# Patient Record
Sex: Male | Born: 1955 | Race: Black or African American | Hispanic: No | Marital: Married | Smoking: Former smoker
Health system: Southern US, Community
[De-identification: ages and names within clinical notes are randomized; demographics above are authoritative.]

## PROBLEM LIST (undated history)

## (undated) DIAGNOSIS — N400 Enlarged prostate without lower urinary tract symptoms: Secondary | ICD-10-CM

## (undated) DIAGNOSIS — I10 Essential (primary) hypertension: Secondary | ICD-10-CM

## (undated) DIAGNOSIS — R972 Elevated prostate specific antigen [PSA]: Secondary | ICD-10-CM

## (undated) DIAGNOSIS — E785 Hyperlipidemia, unspecified: Secondary | ICD-10-CM

## (undated) DIAGNOSIS — C801 Malignant (primary) neoplasm, unspecified: Secondary | ICD-10-CM

## (undated) DIAGNOSIS — I358 Other nonrheumatic aortic valve disorders: Secondary | ICD-10-CM

## (undated) DIAGNOSIS — M25473 Effusion, unspecified ankle: Secondary | ICD-10-CM

## (undated) HISTORY — DX: Hyperlipidemia, unspecified: E78.5

## (undated) HISTORY — DX: Essential (primary) hypertension: I10

## (undated) HISTORY — DX: Elevated prostate specific antigen (PSA): R97.20

## (undated) HISTORY — DX: Effusion, unspecified ankle: M25.473

## (undated) HISTORY — PX: OTHER SURGICAL HISTORY: SHX169

## (undated) HISTORY — DX: Other nonrheumatic aortic valve disorders: I35.8

## (undated) HISTORY — DX: Benign prostatic hyperplasia without lower urinary tract symptoms: N40.0

---

## 2013-02-08 ENCOUNTER — Ambulatory Visit: Payer: Self-pay | Admitting: Family Medicine

## 2013-10-14 DIAGNOSIS — N400 Enlarged prostate without lower urinary tract symptoms: Secondary | ICD-10-CM | POA: Insufficient documentation

## 2014-04-17 DIAGNOSIS — I358 Other nonrheumatic aortic valve disorders: Secondary | ICD-10-CM | POA: Insufficient documentation

## 2014-04-17 DIAGNOSIS — M25473 Effusion, unspecified ankle: Secondary | ICD-10-CM | POA: Insufficient documentation

## 2014-11-21 DIAGNOSIS — R972 Elevated prostate specific antigen [PSA]: Secondary | ICD-10-CM | POA: Insufficient documentation

## 2014-11-21 HISTORY — DX: Elevated prostate specific antigen (PSA): R97.20

## 2014-12-26 ENCOUNTER — Encounter: Payer: Self-pay | Admitting: Urology

## 2014-12-26 ENCOUNTER — Ambulatory Visit (INDEPENDENT_AMBULATORY_CARE_PROVIDER_SITE_OTHER): Payer: Self-pay | Admitting: Urology

## 2014-12-26 VITALS — BP 159/94 | HR 105 | Ht 68.0 in | Wt 172.5 lb

## 2014-12-26 DIAGNOSIS — I10 Essential (primary) hypertension: Secondary | ICD-10-CM

## 2014-12-26 DIAGNOSIS — R972 Elevated prostate specific antigen [PSA]: Secondary | ICD-10-CM

## 2014-12-26 DIAGNOSIS — R300 Dysuria: Secondary | ICD-10-CM

## 2014-12-26 DIAGNOSIS — N401 Enlarged prostate with lower urinary tract symptoms: Secondary | ICD-10-CM

## 2014-12-26 DIAGNOSIS — N138 Other obstructive and reflux uropathy: Secondary | ICD-10-CM

## 2014-12-26 DIAGNOSIS — E785 Hyperlipidemia, unspecified: Secondary | ICD-10-CM | POA: Insufficient documentation

## 2014-12-26 HISTORY — DX: Hyperlipidemia, unspecified: E78.5

## 2014-12-26 HISTORY — DX: Essential (primary) hypertension: I10

## 2014-12-26 LAB — MICROSCOPIC EXAMINATION

## 2014-12-26 LAB — URINALYSIS, COMPLETE
BILIRUBIN UA: NEGATIVE
Glucose, UA: NEGATIVE
Ketones, UA: NEGATIVE
Nitrite, UA: NEGATIVE
PH UA: 6.5 (ref 5.0–7.5)
Urobilinogen, Ur: 0.2 mg/dL (ref 0.2–1.0)

## 2014-12-26 LAB — BLADDER SCAN AMB NON-IMAGING

## 2014-12-26 NOTE — Patient Instructions (Signed)

## 2014-12-26 NOTE — Progress Notes (Signed)
12/26/2014 9:56 AM   Todd Trevino April 10, 1956 [id number]  Referring provider: No referring provider defined for this encounter.  Chief Complaint  Patient presents with  . Elevated PSA    New patient  . Benign Prostatic Hypertrophy    HPI: 59 yo AAM referred by Dr. Ola Spurr for ~1 year of urinary symptoms including terminal dysuria and occation urinary frequency/ urgency and elevated PSA up to 11.47 ng/dL.  He does have variable urinary flow, sometimes good, sometimes not as good.  He does feel that he is able to empty his bladder for the most part.   No gross hematuria.  He has severe nocturia x 5.  No incontinence.  He's had several UAs which are somewhat suspicious but urine cultures have been negative.  His symptoms are sometimes better and sometimes worse but his symptoms have primarily been over the past year.    He does drink a lot of water throughout the day.     He was started on Flomax by his PCP by ~6 months ago which does help but he has only been using in intermittently.  He recently started back on this medication.   He just completed a course of Bactrim x 28 day for presumed prostatitis which has improved his symptoms.   He has seen some mild improvement in his voiding symptoms since starting this medication.   +FH prostate cancer in his younger brother, s/p prostatectomy 1 year ago.    PSA trend below:  Component Name  11/14/2014 07/10/2014 04/10/2014 10/07/2013    11.47 (H) 5.65 (H) 4.86 (H) 5.05 (H)   PSA (Prostate Specific Antigen), Total         IPSS      12/26/14 1000       International Prostate Symptom Score   How often have you had the sensation of not emptying your bladder? About half the time     How often have you had to urinate less than every two hours? Almost always     How often have you found you stopped and started again several times when you urinated? About half the time     How often have you found it difficult to postpone  urination? More than half the time     How often have you had a weak urinary stream? About half the time     How often have you had to strain to start urination? Not at All     How many times did you typically get up at night to urinate? 5 Times     Total IPSS Score 23     Quality of Life due to urinary symptoms   If you were to spend the rest of your life with your urinary condition just the way it is now how would you feel about that? Unhappy          PMH: Past Medical History  Diagnosis Date  . Hyperlipidemia   . HTN (hypertension)   . BPH (benign prostatic hyperplasia)   . Aortic heart murmur   . Ankle edema     Surgical History: Past Surgical History  Procedure Laterality Date  . None      Home Medications:    Medication List       This list is accurate as of: 12/26/14  9:56 AM.  Always use your most recent med list.               amLODipine 2.5 MG tablet  Commonly known as:  NORVASC  Take 2.5 mg by mouth daily.     aspirin 81 MG tablet  Take 81 mg by mouth daily.     lisinopril-hydrochlorothiazide 10-12.5 MG per tablet  Commonly known as:  PRINZIDE,ZESTORETIC  Take 1 tablet by mouth daily.     pravastatin 40 MG tablet  Commonly known as:  PRAVACHOL  Take 40 mg by mouth daily.     tamsulosin 0.4 MG Caps capsule  Commonly known as:  FLOMAX  Take 0.4 mg by mouth.        Allergies: No Known Allergies  Family History: Family History  Problem Relation Age of Onset  . Stomach cancer Father   . Prostate cancer Brother   . Hypertension Sister     Social History:  reports that he has quit smoking. He does not have any smokeless tobacco history on file. He reports that he does not drink alcohol or use illicit drugs.  ROS: UROLOGY Frequent Urination?: Yes Hard to postpone urination?: Yes Burning/pain with urination?: Yes Get up at night to urinate?: Yes Leakage of urine?: No Urine stream starts and stops?: Yes Trouble starting stream?: No Do  you have to strain to urinate?: No Blood in urine?: No Urinary tract infection?: Yes Sexually transmitted disease?: No Injury to kidneys or bladder?: No Painful intercourse?: No Weak stream?: No Erection problems?: No Penile pain?: Yes  Gastrointestinal Nausea?: No Vomiting?: No Indigestion/heartburn?: No Diarrhea?: No Constipation?: No  Constitutional Fever: No Night sweats?: No Weight loss?: No Fatigue?: No  Skin Skin rash/lesions?: No Itching?: No  Eyes Blurred vision?: Yes Double vision?: No  Ears/Nose/Throat Sore throat?: No Sinus problems?: No  Hematologic/Lymphatic Swollen glands?: Yes Easy bruising?: No  Cardiovascular Leg swelling?: Yes Chest pain?: No  Respiratory Cough?: No Shortness of breath?: No  Endocrine Excessive thirst?: No  Musculoskeletal Back pain?: Yes Joint pain?: Yes  Neurological Headaches?: No Dizziness?: No  Psychologic Depression?: No Anxiety?: No  Physical Exam: BP 159/94 mmHg  Pulse 105  Ht 5\' 8"  (1.727 m)  Wt 172 lb 8 oz (78.245 kg)  BMI 26.23 kg/m2  Constitutional:  Alert and oriented, No acute distress. HEENT: [place] AT, moist mucus membranes.  Trachea midline, no masses. Cardiovascular: No clubbing, cyanosis, or edema. Respiratory: Normal respiratory effort, no increased work of breathing. GI: Abdomen is soft, nontender, nondistended, no abdominal masses GU: No CVA tenderness. Bilaterally descended testicles. No masses. Uncircumcised phallus with easily retractable foreskin. Rectal exam today with normal external sphincter, 30+ cc prostate, nontender, no nodules. Skin: No rashes, bruises or suspicious lesions. Lymph: No cervical or inguinal adenopathy. Neurologic: Grossly intact, no focal deficits, moving all 4 extremities. Psychiatric: Normal mood and affect.  Laboratory Data: PSA (Prostate Specific Antigen), Total 11.47 (H) 0.10-4.00 ng/mL    Urinalysis Results for orders placed or performed in visit  on 12/26/14  Microscopic Examination  Result Value Ref Range   WBC, UA >30W 0 -  5 /hpf   RBC, UA >30R 0 -  2 /hpf   Epithelial Cells (non renal) >10E 0 - 10 /hpf   Mucus, UA Present (A) Not Estab.   Bacteria, UA Moderate (A) None seen/Few  Urinalysis, Complete  Result Value Ref Range   Specific Gravity, UA >1.030 (H) 1.005 - 1.030   pH, UA 6.5 5.0 - 7.5   Color, UA Yellow Yellow   Appearance Ur Cloudy (A) Clear   Leukocytes, UA 2+ (A) Negative   Protein, UA 3+ (A) Negative/Trace   Glucose,  UA Negative Negative   Ketones, UA Negative Negative   RBC, UA 3+ (A) Negative   Bilirubin, UA Negative Negative   Urobilinogen, Ur 0.2 0.2 - 1.0 mg/dL   Nitrite, UA Negative Negative   Microscopic Examination See below:   BLADDER SCAN AMB NON-IMAGING  Result Value Ref Range   Scan Result 3ml     Pertinent Imaging: PVR 25   Assessment & Plan:  59 year old male with BPH with LUTS, dysuria and most recently an elevated PSA to 11. His UA today is suspicious for infection although previous culture has been negative and he recently completed a course of antibiotics.  Repeat the culture today as well as his PSA. If his culture is negative and the PSA remains elevated, I would recommend proceeding with prostate biopsy.  1. BPH with obstruction/lower urinary tract symptoms - continue Flomax - Urinalysis, Complete - BLADDER SCAN AMB NON-IMAGING  2. Elevated PSA  We reviewed the implications of an elevated PSA and the uncertainty surrounding it. In general, a man's PSA increases with age and is produced by both normal and cancerous prostate tissue. Differential for elevated PSA is BPH, prostate cancer, infection, recent intercourse/ejaculation, prostate infarction, recent urethroscopic manipulation (foley placement/cystoscopy) and prostatitis. Management of an elevated PSA can include observation or prostate biopsy and wediscussed this in detail.  We discussed that indications for prostate biopsy are  defined by age and race specific PSA cutoffs as well as a PSA velocity of 0.75/year.  We discussed prostate biopsy in detail including the procedure itself, the risks of blood in the urine, stool, and ejaculate, serious infection, and discomfort. He is willing to proceed with this as discussed.  - PSA -will call with recommendation  3. Dysuria -UA suspicious, will send of culture and treat as needed -if culture negative, will need repeat UA/ possible cysto - CULTURE, URINE COMPREHENSIVE   Will call with follow up plan.  Hollice Espy, MD  [facility] [address] [place], [place] [address] [phone]

## 2014-12-27 ENCOUNTER — Telehealth: Payer: Self-pay

## 2014-12-27 LAB — PSA: Prostate Specific Ag, Serum: 10.3 ng/mL — ABNORMAL HIGH (ref 0.0–4.0)

## 2014-12-27 NOTE — Telephone Encounter (Signed)
-----   Message from Hollice Espy, MD sent at 12/27/2014  7:35 AM EDT ----- Please let this patient know that his PSA is down somewhat but his urine was highly suspicious for infection.  As such, I'd like to call in some abx once I have the culture results, and then have him return in 3 months for PSA/ DRE and recheck of symptoms (any provider).    Hollice Espy, MD  Please forward note to admin to arrange follow up.

## 2014-12-27 NOTE — Telephone Encounter (Signed)
LMOM

## 2014-12-27 NOTE — Telephone Encounter (Signed)
Spoke with pt in reference to urine cx and f/u. Pt voiced understanding. Pt was transferred to the front to make f/u appt.

## 2014-12-28 LAB — CULTURE, URINE COMPREHENSIVE

## 2015-03-29 ENCOUNTER — Ambulatory Visit: Payer: Self-pay | Admitting: Urology

## 2015-06-29 ENCOUNTER — Encounter: Payer: Self-pay | Admitting: Urology

## 2015-06-29 ENCOUNTER — Ambulatory Visit (INDEPENDENT_AMBULATORY_CARE_PROVIDER_SITE_OTHER): Payer: BLUE CROSS/BLUE SHIELD | Admitting: Urology

## 2015-06-29 VITALS — BP 164/83 | HR 74 | Ht 68.0 in | Wt 181.1 lb

## 2015-06-29 DIAGNOSIS — N400 Enlarged prostate without lower urinary tract symptoms: Secondary | ICD-10-CM | POA: Diagnosis not present

## 2015-06-29 DIAGNOSIS — R972 Elevated prostate specific antigen [PSA]: Secondary | ICD-10-CM | POA: Diagnosis not present

## 2015-06-29 DIAGNOSIS — N390 Urinary tract infection, site not specified: Secondary | ICD-10-CM

## 2015-06-29 DIAGNOSIS — R8281 Pyuria: Secondary | ICD-10-CM

## 2015-06-29 LAB — URINALYSIS, COMPLETE
Bilirubin, UA: NEGATIVE
Glucose, UA: NEGATIVE
Ketones, UA: NEGATIVE
Nitrite, UA: NEGATIVE
Specific Gravity, UA: >1.03 — ABNORMAL HIGH (ref 1.005–1.030)
Urobilinogen, Ur: 0.2 mg/dL (ref 0.2–1.0)
pH, UA: 6 (ref 5.0–7.5)

## 2015-06-29 LAB — MICROSCOPIC EXAMINATION

## 2015-06-29 LAB — BLADDER SCAN AMB NON-IMAGING: SCAN RESULT: 31
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
## 2015-06-30 LAB — PSA: PROSTATE SPECIFIC AG, SERUM: 6.6 ng/mL — AB (ref 0.0–4.0)

## 2015-07-02 LAB — CULTURE, URINE COMPREHENSIVE

## 2015-07-09 ENCOUNTER — Telehealth: Payer: Self-pay

## 2015-07-09 NOTE — Telephone Encounter (Signed)
LMOM

## 2015-07-09 NOTE — Telephone Encounter (Signed)
-----   Message from Hollice Espy, MD sent at 07/08/2015  2:52 PM EST ----- PSA down to 6.6 from 10.3 but still quite elevated.  Recommend biopsy but if he refuses, then recommend repeat PSA/ DRE in 4 months at minimum and stress importance of close follow up. Please arrange for this.  Also review biopsy instructions if elects to proceed with biopsy.    Hollice Espy, MD

## 2015-07-10 NOTE — Telephone Encounter (Signed)
Spoke with pt in reference to PSA and prostate bx. Pt elected to monitor PSA closely. Pt asked is there any type of medication he can take to help get his levels lowered. Please advise.

## 2015-07-10 NOTE — Telephone Encounter (Signed)
LMOM

## 2015-12-28 ENCOUNTER — Encounter: Payer: Self-pay | Admitting: Urology

## 2015-12-28 ENCOUNTER — Ambulatory Visit (INDEPENDENT_AMBULATORY_CARE_PROVIDER_SITE_OTHER): Payer: BLUE CROSS/BLUE SHIELD | Admitting: Urology

## 2015-12-28 VITALS — BP 221/122 | HR 90 | Ht 68.0 in | Wt 188.2 lb

## 2015-12-28 DIAGNOSIS — N138 Other obstructive and reflux uropathy: Secondary | ICD-10-CM

## 2015-12-28 DIAGNOSIS — N401 Enlarged prostate with lower urinary tract symptoms: Secondary | ICD-10-CM

## 2015-12-28 DIAGNOSIS — R972 Elevated prostate specific antigen [PSA]: Secondary | ICD-10-CM

## 2015-12-28 DIAGNOSIS — I160 Hypertensive urgency: Secondary | ICD-10-CM

## 2015-12-28 NOTE — Progress Notes (Signed)
10:28 AM  12/28/15   Maida Sale [birth year] [id number]  Referring provider: Leonel Ramsay, MD [place], [place] [address]  Chief Complaint  Patient presents with  . Follow-up    BPH    HPI: 60 yo AAM with BPH with LUTS and history of elevated PSA.  BPH with LUTS Resumed Flomax since last visit, has seen some improvement in flow/ stream.  Recent taken off HCTZ with improvement in urinary urgency.  Getting up every 1.5 hours at night to urinate.  He does drink a lot of water.    Previous PVRs all less than 100 cc.    No UTI symptoms.        IPSS    Row Name 12/28/15 1000         International Prostate Symptom Score   How often have you had the sensation of not emptying your bladder? About half the time     How often have you had to urinate less than every two hours? More than half the time     How often have you found you stopped and started again several times when you urinated? Less than half the time     How often have you found it difficult to postpone urination? More than half the time     How often have you had a weak urinary stream? Less than 1 in 5 times     How often have you had to strain to start urination? Not at All     How many times did you typically get up at night to urinate? 5 Times     Total IPSS Score 19       Quality of Life due to urinary symptoms   If you were to spend the rest of your life with your urinary condition just the way it is now how would you feel about that? Mixed        Score:  1-7 Mild 8-19 Moderate 20-35 Severe   History of elevated PSA +FH prostate cancer in his younger brother, s/p prostatectomy 1.5 years ago.    PSA trend below:  5.05   09/2013 4.86    04/2014 5.65    07/2014 11.47   11/2014 10.3    12/2014 6.6    06/2015  Rectal exam last visit 30cc with induration at apex but no nodules.  PSA repeated today.     HTN Recent adjustment in BP meds.  Profoundly HTN today,  asymptomatic.  Has not taking losartan for past 2 days due to stomach upset.     PMH: Past Medical History:  Diagnosis Date  . Abnormal prostate specific antigen 11/21/2014  . Ankle edema   . Aortic heart murmur   . BP (high blood pressure) 12/26/2014  . BPH (benign prostatic hyperplasia)   . Elevated PSA   . HLD (hyperlipidemia) 12/26/2014  . HTN (hypertension)   . Hyperlipidemia     Surgical History: Past Surgical History:  Procedure Laterality Date  . none      Home Medications:    Medication List       Accurate as of 12/28/15 10:28 AM. Always use your most recent med list.          amLODipine 2.5 MG tablet Commonly known as:  NORVASC Take 2.5 mg by mouth daily.   aspirin 81 MG tablet Take 81 mg by mouth daily.   lisinopril-hydrochlorothiazide 10-12.5 MG tablet Commonly known as:  PRINZIDE,ZESTORETIC Take  1 tablet by mouth daily.   losartan 50 MG tablet Commonly known as:  COZAAR Take by mouth.   pravastatin 40 MG tablet Commonly known as:  PRAVACHOL Take 40 mg by mouth daily.   tamsulosin 0.4 MG Caps capsule Commonly known as:  FLOMAX Take 0.4 mg by mouth. Reported on 06/29/2015       Allergies: No Known Allergies  Family History: Family History  Problem Relation Age of Onset  . Stomach cancer Father   . Prostate cancer Brother   . Hypertension Sister   . Kidney disease Neg Hx     Social History:  reports that he has quit smoking. He has never used smokeless tobacco. He reports that he does not drink alcohol or use drugs.  ROS: UROLOGY Frequent Urination?: Yes Hard to postpone urination?: Yes Burning/pain with urination?: Yes Get up at night to urinate?: Yes Leakage of urine?: No Urine stream starts and stops?: Yes Trouble starting stream?: No Do you have to strain to urinate?: No Blood in urine?: No Urinary tract infection?: No Sexually transmitted disease?: No Injury to kidneys or bladder?: No Painful intercourse?: No Weak  stream?: No Erection problems?: No Penile pain?: No  Gastrointestinal Nausea?: No Vomiting?: No Indigestion/heartburn?: No Diarrhea?: No Constipation?: No  Constitutional Fever: No Night sweats?: No Weight loss?: No Fatigue?: No  Skin Skin rash/lesions?: No Itching?: No  Eyes Blurred vision?: No Double vision?: No  Ears/Nose/Throat Sore throat?: No Sinus problems?: No  Hematologic/Lymphatic Swollen glands?: No Easy bruising?: No  Cardiovascular Leg swelling?: No Chest pain?: No  Respiratory Cough?: No Shortness of breath?: No  Endocrine Excessive thirst?: No  Musculoskeletal Back pain?: No Joint pain?: No  Neurological Headaches?: No Dizziness?: No     Physical Exam: BP (!) 221/122 (BP Location: Left Arm, Patient Position: Sitting, Cuff Size: Normal)   Pulse 90   Ht 5\' 8"  (1.727 m)   Wt 188 lb 3.2 oz (85.4 kg)   BMI 28.62 kg/m   Constitutional:  Alert and oriented, No acute distress. HEENT: [place] AT, moist mucus membranes.  Trachea midline, no masses. Cardiovascular: No clubbing, cyanosis, or edema. Respiratory: Normal respiratory effort, no increased work of breathing. GI: Abdomen is soft, nontender, nondistended, no abdominal masses GU: No CVA tenderness.  Rectal exam deferred today, performed last 6 months ago 06/2015 Skin: No rashes, bruises or suspicious lesions. Neurologic: Grossly intact, no focal deficits, moving all 4 extremities. Psychiatric: Normal mood and affect.  Laboratory Data: PSA trend as above Assessment & Plan:     1. BPH (benign prostatic hyperplasia) with lower urinary tract symptoms Continue flomax Seen improvement in urinary symptoms after stopping HCTZ Behavioral modification for nocturia discussed- does not have issue when avoids drinking bed  2. Elevated PSA PSA repeated today, history of fluctuation presumably related to inflammation If PSA up again, will consider biopsy.  He will need to stop ASA if biopsy  recommended.  Reviewed procedure. Will call with plan. - PSA   3. Hypertensive urgency Asymptomatic Discussed with Dr. Blane Ohara nurse, advised to resume lisinopril-HCTZ today if unable to tolerated Losartan Follow up with PCP ED if becomes symptomatic  Hollice Espy, MD  [facility] [address] [place], [place] [address] [phone]

## 2015-12-29 LAB — PSA: PROSTATE SPECIFIC AG, SERUM: 4.6 ng/mL — AB (ref 0.0–4.0)

## 2015-12-31 ENCOUNTER — Telehealth: Payer: Self-pay | Admitting: *Deleted

## 2015-12-31 NOTE — Telephone Encounter (Signed)
LMOM for patient to call office back. Psa continues to improve and we will continue to follow as discussed.

## 2015-12-31 NOTE — Telephone Encounter (Signed)
-----   Message from Hollice Espy, MD sent at 12/30/2015  1:34 PM EDT ----- PSA continues to improve.  We will continue to follow as discussed.  Hollice Espy, MD

## 2016-01-02 NOTE — Telephone Encounter (Signed)
LMOM for patient to call office back. 

## 2016-06-12 ENCOUNTER — Encounter: Payer: Self-pay | Admitting: Urology

## 2016-06-12 ENCOUNTER — Ambulatory Visit: Payer: BLUE CROSS/BLUE SHIELD | Admitting: Urology

## 2016-06-12 VITALS — BP 169/93 | HR 67 | Ht 68.0 in | Wt 176.2 lb

## 2016-06-12 DIAGNOSIS — N400 Enlarged prostate without lower urinary tract symptoms: Secondary | ICD-10-CM

## 2016-06-12 DIAGNOSIS — R350 Frequency of micturition: Secondary | ICD-10-CM

## 2016-06-12 DIAGNOSIS — R3129 Other microscopic hematuria: Secondary | ICD-10-CM | POA: Diagnosis not present

## 2016-06-12 DIAGNOSIS — R972 Elevated prostate specific antigen [PSA]: Secondary | ICD-10-CM | POA: Diagnosis not present

## 2016-06-12 LAB — URINALYSIS, COMPLETE
Bilirubin, UA: NEGATIVE
Glucose, UA: NEGATIVE
Nitrite, UA: NEGATIVE
PH UA: 7 (ref 5.0–7.5)
Specific Gravity, UA: 1.02 (ref 1.005–1.030)
Urobilinogen, Ur: 4 mg/dL — ABNORMAL HIGH (ref 0.2–1.0)

## 2016-06-12 LAB — MICROSCOPIC EXAMINATION: RBC, UA: >30 /hpf — AB (ref 0–?)

## 2016-06-12 LAB — BLADDER SCAN AMB NON-IMAGING: SCAN RESULT: 0

## 2016-06-12 NOTE — Progress Notes (Signed)
3:15 PM  06/12/16   Maida Sale December 29, 1955 [id number]  Referring provider: Leonel Ramsay, MD [place], [place] [address]  Chief Complaint  Patient presents with  . Benign Prostatic Hypertrophy    6 month follow up  . Elevated PSA    HPI: Patient is a 61 yo [place] American male with BPH with LUTS and history of elevated PSA who presents today with the complaint of increased night time urination.  BPH WITH LUTS His IPSS score today is 26, which is severe lower urinary tract symptomatology. He is unhappy with his quality life due to his urinary symptoms.  His PVR is 0 mL.      His major complaint today are frequency, urgency, dysuria, nocturia x 5, intermittency, straining to urinate and penile pain.  He has had these symptoms for the last several weeks.  He denies any dysuria, hematuria or suprapubic pain.   He currently taking tamsulosin 0.4 mg daily.   He also denies any recent fevers, chills, nausea or vomiting.  He was seen last week at [facility] urgent care and UA was positive for 10-50WBC's, 10-50 RBC's and rare bacteria.  Patient was given a 5 day course of Cipro without relief.  His urine culture was negative.  His UA today was positive for > 30 WBC's and > 30 RBC's.       IPSS    Row Name 06/12/16 1400         International Prostate Symptom Score   How often have you had the sensation of not emptying your bladder? Almost always     How often have you had to urinate less than every two hours? Almost always     How often have you found you stopped and started again several times when you urinated? Less than 1 in 5 times     How often have you found it difficult to postpone urination? Almost always     How often have you had a weak urinary stream? Less than 1 in 5 times     How often have you had to strain to start urination? More than half the time     How many times did you typically get up at  night to urinate? 5 Times     Total IPSS Score 26       Quality of Life due to urinary symptoms   If you were to spend the rest of your life with your urinary condition just the way it is now how would you feel about that? Unhappy        Score:  1-7 Mild 8-19 Moderate 20-35 Severe   History of elevated PSA +FH prostate cancer in his younger brother, s/p prostatectomy 1.5 years ago.    PSA trend below:  5.05   09/2013 4.86    04/2014 5.65    07/2014 11.47   11/2014 10.3    12/2014 6.6    06/2015  Rectal exam last visit 30cc with induration at apex but no nodules.  PSA repeated today.     PMH: Past Medical History:  Diagnosis Date  . Abnormal prostate specific antigen 11/21/2014  . Ankle edema   . Aortic heart murmur   . BP (high blood pressure) 12/26/2014  . BPH (benign prostatic hyperplasia)   . Elevated PSA   . HLD (hyperlipidemia) 12/26/2014  . HTN (hypertension)   . Hyperlipidemia     Surgical History:  Past Surgical History:  Procedure Laterality Date  . none      Home Medications:  Allergies as of 06/12/2016   No Known Allergies     Medication List       Accurate as of 06/12/16  3:15 PM. Always use your most recent med list.          amLODipine 2.5 MG tablet Commonly known as:  NORVASC Take 2.5 mg by mouth daily.   aspirin 81 MG tablet Take 81 mg by mouth daily.   ciprofloxacin 500 MG tablet Commonly known as:  CIPRO Take by mouth.   lisinopril-hydrochlorothiazide 10-12.5 MG tablet Commonly known as:  PRINZIDE,ZESTORETIC Take 1 tablet by mouth daily.   losartan 50 MG tablet Commonly known as:  COZAAR Take by mouth.   metoprolol succinate 50 MG 24 hr tablet Commonly known as:  TOPROL-XL Take by mouth.   pravastatin 40 MG tablet Commonly known as:  PRAVACHOL Take 40 mg by mouth daily.   tamsulosin 0.4 MG Caps capsule Commonly known as:  FLOMAX Take 0.4 mg by mouth. Reported on 06/29/2015       Allergies: No Known  Allergies  Family History: Family History  Problem Relation Age of Onset  . Stomach cancer Father   . Prostate cancer Brother   . Hypertension Sister   . Kidney disease Neg Hx     Social History:  reports that he has quit smoking. He has never used smokeless tobacco. He reports that he does not drink alcohol or use drugs.  ROS: UROLOGY Frequent Urination?: Yes Hard to postpone urination?: Yes Burning/pain with urination?: Yes Get up at night to urinate?: Yes Leakage of urine?: No Urine stream starts and stops?: Yes Trouble starting stream?: No Do you have to strain to urinate?: Yes Blood in urine?: No Urinary tract infection?: No Sexually transmitted disease?: No Injury to kidneys or bladder?: No Painful intercourse?: No Weak stream?: No Erection problems?: No Penile pain?: Yes  Gastrointestinal Nausea?: No Vomiting?: No Indigestion/heartburn?: No Diarrhea?: No Constipation?: No  Constitutional Fever: No Night sweats?: No Weight loss?: No Fatigue?: No  Skin Skin rash/lesions?: No Itching?: No  Eyes Blurred vision?: No Double vision?: No  Ears/Nose/Throat Sore throat?: No Sinus problems?: No  Hematologic/Lymphatic Swollen glands?: No Easy bruising?: No  Cardiovascular Leg swelling?: No Chest pain?: No  Respiratory Cough?: No Shortness of breath?: No  Endocrine Excessive thirst?: No  Musculoskeletal Back pain?: No Joint pain?: No  Neurological Headaches?: No Dizziness?: No  Psychologic Depression?: No Anxiety?: No  Physical Exam: BP (!) 169/93   Pulse 67   Ht 5\' 8"  (1.727 m)   Wt 176 lb 3.2 oz (79.9 kg)   BMI 26.79 kg/m   Constitutional:  Alert and oriented, No acute distress. HEENT: [place] AT, moist mucus membranes.  Trachea midline, no masses. Cardiovascular: No clubbing, cyanosis, or edema. Respiratory: Normal respiratory effort, no increased work of breathing. GI: Abdomen is soft, nontender, nondistended, no abdominal  masses GU: No CVA tenderness.  Patient with uncircumcised phallus.  Foreskin easily retracted  Urethral meatus is patent.  No penile discharge. No penile lesions or rashes. Scrotum without lesions, cysts, rashes and/or edema.  Testicles are located scrotally bilaterally. No masses are appreciated in the testicles. Left and right epididymis are normal. Rectal: Patient with  normal sphincter tone. Anus and perineum without scarring or rashes. No rectal masses are appreciated. Prostate is approximately 70 grams, no nodules are appreciated. Seminal vesicles are normal. Skin: No rashes, bruises or  suspicious lesions. Neurologic: Grossly intact, no focal deficits, moving all 4 extremities. Psychiatric: Normal mood and affect.  Laboratory Data: PSA trend below:   5.05   09/2013  4.86    04/2014  5.65    07/2014  11.47   11/2014  10.3    12/2014  6.6    06/2015  Pertinent imaging Results for JOSIP, CAVE (MRN [id number]) as of 06/12/2016 14:59  Ref. Range 06/12/2016 14:54  Scan Result Unknown 0    Assessment & Plan:    1. Microscopic hematuria  - I explained to the patient that there are a number of causes that can be associated with blood in the urine, such as stones,  BPH, UTI's, damage to the urinary tract and/or cancer.  - At this time, I felt that the patient warranted further urologic evaluation.   The AUA guidelines state that a CT urogram is the preferred imaging study to evaluate hematuria.  - I explained to the patient that a contrast material will be injected into a vein and that in rare instances, an allergic reaction can result and may even life threatening   The patient denies any allergies to contrast, iodine and/or seafood and is not taking metformin.  - Following the imaging study,  I've recommended a cystoscopy. I described how this is performed, typically in an office setting with a flexible cystoscope. We described the risks, benefits, and possible side effects, the most common  of which is a minor amount of blood in the urine and/or burning which usually resolves in 24 to 48 hours.    - The patient had the opportunity to ask questions which were answered. Based upon this discussion, the patient is willing to proceed. Therefore, I've ordered: a CT Urogram and cystoscopy.  - The patient will return following all of the above for discussion of the results.      - UA  - urine culture  - BUN + creatinine  - will post pone if urine culture is positive   2. BPH with LUTS  - IPSS score is 26/5, it is worsening  - Continue conservative management, avoiding bladder irritants and timed voiding's  - Continue tamsulosin 0.4 mg daily; refills given   - PSA  - RTC in 6 months for IPSS, PSA, PVR and exam   - Toviaz 4 mg daily, # 14  I have advised  of the side effects, such as: Dry eyes, dry mouth, constipation, mental confusion and/or urinary retention. To help with symptoms until work up is complete  3. Elevated PSA  - PSA repeated today, history of fluctuation presumably related to inflammation If PSA up again, will consider biopsy.  He will need to stop ASA if biopsy recommended.  Reviewed procedure.  Zara Council, [facility] [address] [place], [place] [address] [phone]

## 2016-06-13 LAB — BUN+CREAT
BUN / CREAT RATIO: 14 (ref 10–24)
BUN: 16 mg/dL (ref 8–27)
Creatinine, Ser: 1.17 mg/dL (ref 0.76–1.27)
GFR calc Af Amer: 77 mL/min/{1.73_m2} (ref >59)
GFR calc non Af Amer: 66 mL/min/{1.73_m2} (ref >59)

## 2016-06-13 LAB — PSA: PROSTATE SPECIFIC AG, SERUM: 14.8 ng/mL — AB (ref 0.0–4.0)

## 2016-06-15 LAB — CULTURE, URINE COMPREHENSIVE

## 2016-06-30 ENCOUNTER — Ambulatory Visit
Admission: RE | Admit: 2016-06-30 | Discharge: 2016-06-30 | Disposition: A | Discharge status: Home / Self Care | Payer: BLUE CROSS/BLUE SHIELD | Source: Ambulatory Visit | Attending: Urology | Admitting: Urology

## 2016-06-30 DIAGNOSIS — N400 Enlarged prostate without lower urinary tract symptoms: Secondary | ICD-10-CM | POA: Diagnosis not present

## 2016-06-30 DIAGNOSIS — N210 Calculus in bladder: Secondary | ICD-10-CM | POA: Diagnosis not present

## 2016-06-30 DIAGNOSIS — N329 Bladder disorder, unspecified: Secondary | ICD-10-CM | POA: Insufficient documentation

## 2016-06-30 DIAGNOSIS — R3129 Other microscopic hematuria: Secondary | ICD-10-CM | POA: Diagnosis not present

## 2016-06-30 DIAGNOSIS — K4020 Bilateral inguinal hernia, without obstruction or gangrene, not specified as recurrent: Secondary | ICD-10-CM | POA: Diagnosis not present

## 2016-06-30 DIAGNOSIS — N200 Calculus of kidney: Secondary | ICD-10-CM | POA: Diagnosis not present

## 2016-06-30 MED ORDER — IOPAMIDOL (ISOVUE-300) INJECTION 61%
125.0000 mL | Freq: Once | INTRAVENOUS | Status: AC | PRN
  Administered 2016-06-30: 125 mL via INTRAVENOUS

## 2016-07-02 ENCOUNTER — Ambulatory Visit: Payer: BLUE CROSS/BLUE SHIELD | Admitting: Urology

## 2016-07-03 ENCOUNTER — Encounter: Payer: Self-pay | Admitting: Urology

## 2016-07-03 ENCOUNTER — Ambulatory Visit (INDEPENDENT_AMBULATORY_CARE_PROVIDER_SITE_OTHER): Payer: BLUE CROSS/BLUE SHIELD | Admitting: Urology

## 2016-07-03 VITALS — BP 191/111 | HR 82 | Ht 68.0 in | Wt 181.0 lb

## 2016-07-03 DIAGNOSIS — N138 Other obstructive and reflux uropathy: Secondary | ICD-10-CM

## 2016-07-03 DIAGNOSIS — N210 Calculus in bladder: Secondary | ICD-10-CM

## 2016-07-03 DIAGNOSIS — R972 Elevated prostate specific antigen [PSA]: Secondary | ICD-10-CM

## 2016-07-03 DIAGNOSIS — N401 Enlarged prostate with lower urinary tract symptoms: Secondary | ICD-10-CM

## 2016-07-03 DIAGNOSIS — N200 Calculus of kidney: Secondary | ICD-10-CM | POA: Diagnosis not present

## 2016-07-03 DIAGNOSIS — R3129 Other microscopic hematuria: Secondary | ICD-10-CM | POA: Diagnosis not present

## 2016-07-03 LAB — URINALYSIS, COMPLETE
BILIRUBIN UA: NEGATIVE
Glucose, UA: NEGATIVE
Nitrite, UA: NEGATIVE
PH UA: 6 (ref 5.0–7.5)
UUROB: 0.2 mg/dL (ref 0.2–1.0)

## 2016-07-03 LAB — MICROSCOPIC EXAMINATION
Epithelial Cells (non renal): >10 /hpf — AB (ref 0–10)
RBC, UA: >30 /hpf — AB (ref 0–?)

## 2016-07-03 MED ORDER — LIDOCAINE HCL 2 % EX GEL
1.0000 "application " | Freq: Once | CUTANEOUS | Status: AC
  Administered 2016-07-03: 1 via URETHRAL

## 2016-07-03 MED ORDER — CIPROFLOXACIN HCL 500 MG PO TABS
500.0000 mg | ORAL_TABLET | Freq: Once | ORAL | Status: AC
  Administered 2016-07-03: 500 mg via ORAL

## 2016-07-03 MED ORDER — FINASTERIDE 5 MG PO TABS: 5.0000 mg | ORAL_TABLET | Freq: Every day | ORAL | 11 refills | Status: DC

## 2016-07-03 NOTE — Progress Notes (Addendum)
07/03/2016 10:06 AM   Todd Trevino 1956/03/25 [id number]  Referring provider: Leonel Ramsay, MD [place], [place] [address]  Chief Complaint  Patient presents with  . Cysto    HPI: 62 year old male with BPH with lower urinary tract symptoms, history of elevated PSA who returns today after CT urogram for further evaluation of ongoing microscopic hematuria.   BPH WITH LUTS CT urogram shows a large 4.7 x 4.2 cm bladder calculus with diffuse bladder wall thickening appreciated likely related to cystitis versus chronic bladder outlet obstruction. In addition, he has very small bilateral nonobstructing renal calculi and largest of which is 5 mm.  Prostate is only mildly enlarged on CT with slight mass effect into bladder, no other significant GU findings.    He was initially scheduled today for cystoscopy, however, in light of the findings, this was deferred and further management of his large bladder stone was discussed.  At baseline, he does have severe voiding issues, primarily irritative including frequency, urgency, dysuria, nocturia 5, intermittency and weak stream. He also reported a history of penile pain. He is currently on Flomax and was given samples of Toviaz which have not helped much.  He's had several urinalyses which are suspicious for infection but cultures negative.  History of elevated PSA +FH prostate cancer in his younger brother, s/p prostatectomy   PSA trend below:  5.05   09/2013 4.86    04/2014 5.65    07/2014 11.47   11/2014 10.3    12/2014 6.6    06/2015 4.6  7/17 14.8 06/12/16  Rectal exam has been unremarkable, 30 g without nodules   PMH: Past Medical History:  Diagnosis Date  . Abnormal prostate specific antigen 11/21/2014  . Ankle edema   . Aortic heart murmur   . BP (high blood pressure) 12/26/2014  . BPH (benign prostatic hyperplasia)   . Elevated PSA   . HLD  (hyperlipidemia) 12/26/2014  . HTN (hypertension)   . Hyperlipidemia     Surgical History: Past Surgical History:  Procedure Laterality Date  . none      Home Medications:  Allergies as of 07/03/2016   No Known Allergies     Medication List       Accurate as of 07/03/16 10:06 AM. Always use your most recent med list.          amLODipine 2.5 MG tablet Commonly known as:  NORVASC Take 2.5 mg by mouth daily.   aspirin 81 MG tablet Take 81 mg by mouth daily.   finasteride 5 MG tablet Commonly known as:  PROSCAR Take 1 tablet (5 mg total) by mouth daily.   lisinopril-hydrochlorothiazide 10-12.5 MG tablet Commonly known as:  PRINZIDE,ZESTORETIC Take 1 tablet by mouth daily.   losartan 50 MG tablet Commonly known as:  COZAAR Take by mouth.   metoprolol succinate 50 MG 24 hr tablet Commonly known as:  TOPROL-XL Take by mouth.   pravastatin 40 MG tablet Commonly known as:  PRAVACHOL Take 40 mg by mouth daily.   tamsulosin 0.4 MG Caps capsule Commonly known as:  FLOMAX Take 0.4 mg by mouth. Reported on 06/29/2015       Allergies: No Known Allergies  Family History: Family History  Problem Relation Age of Onset  . Stomach cancer Father   . Prostate cancer Brother   . Hypertension Sister   . Kidney disease Neg Hx     Social History:  reports that  he has quit smoking. He has never used smokeless tobacco. He reports that he does not drink alcohol or use drugs.  ROS: 12 point ROS negative other than as per HPI  Physical Exam: BP (!) 191/111   Pulse 82   Ht 5\' 8"  (1.727 m)   Wt 181 lb (82.1 kg)   BMI 27.52 kg/m   Constitutional:  Alert and oriented, No acute distress. HEENT: [place] AT, moist mucus membranes.  Trachea midline, no masses. Cardiovascular: No clubbing, cyanosis, or edema. Respiratory: Normal respiratory effort, no increased work of breathing. GI: Abdomen is soft, nontender, nondistended, no abdominal masses GU: No CVA tenderness.  Skin: No  rashes, bruises or suspicious lesions. Lymph: No cervical or inguinal adenopathy. Neurologic: Grossly intact, no focal deficits, moving all 4 extremities. Psychiatric: Normal mood and affect.  Laboratory Data:  Lab Results  Component Value Date   CREATININE 1.17 06/12/2016   PSA as above   Pertinent Imaging: CLINICAL DATA:  Microscopic hematuria. Worsening dysuria for 1 year.  EXAM: CT ABDOMEN AND PELVIS WITHOUT AND WITH CONTRAST  TECHNIQUE: Multidetector CT imaging of the abdomen and pelvis was performed following the standard protocol before and following the bolus administration of intravenous contrast.  CONTRAST:  183mL ISOVUE-300 IOPAMIDOL (ISOVUE-300) INJECTION 61%  COMPARISON:  None.  FINDINGS: Lower Chest: No acute findings.  Hepatobiliary:  No masses identified. Gallbladder is unremarkable.  Pancreas:  No mass or inflammatory changes.  Spleen: Within normal limits in size and appearance.  Adrenals/Urinary Tract: Several small renal calculi are seen bilaterally, largest measuring 5 mm. No evidence of ureteral calculi or hydronephrosis. A large calculus is seen within the urinary bladder measuring 4.7 x 4.2 cm. Diffuse bladder wall thickening is seen, may be due to cystitis or chronic bladder outlet obstruction.  Stomach/Bowel: No evidence of obstruction, inflammatory process or abnormal fluid collections.  Vascular/Lymphatic: No pathologically enlarged lymph nodes. No abdominal aortic aneurysm.  Reproductive: Mildly enlarged prostate gland, which indents bladder base.  Other:  Small bilateral inguinal hernias containing only fat.  Musculoskeletal:  No suspicious bone lesions identified.  IMPRESSION: Nonobstructing bilateral renal calculi measuring up to 5 mm. No evidence of ureteral calculi or hydronephrosis.  Large 4.7 cm calculus within urinary bladder. Diffuse bladder wall thickening, suspicious for cystitis or chronic bladder  outlet obstruction. Recommend correlation with cystoscopy.  No radiographic evidence of urinary tract neoplasm.  Mildly enlarged prostate.   Electronically Signed   By: Earle Gell M.D.   On: 06/30/2016 10:31   CT scan partially reviewed today with the patient.  Assessment & Plan:    1. Microscopic hematuria Likely secondary to large bladder stone, see below - Urinalysis, Complete - ciprofloxacin (CIPRO) tablet 500 mg; Take 1 tablet (500 mg total) by mouth once. - lidocaine (XYLOCAINE) 2 % jelly 1 application; Place 1 application into the urethra once. - CULTURE, URINE COMPREHENSIVE  2. Bladder stone Large 4.7 cm bladder stone leak and trending to severe irritative voiding symptoms Cystoscopy deferred today in the office as visualization will likely be poor due to presence of large stone  Options for management of the stone include continued observation (not recommended), open cystolithotomy (prefered) vs. endoscopic cystolitholapaxy. Risk and benefits of each were reviewed in detail.  Given the size of the stone, I would strongly recommend open cystolitholapaxy as this will be the most efficient way to remove the entire stone without leaving fragments. We reviewed the surgical procedure itself as well as the postoperative care including need for indwelling  Foley. Risks including bleeding, damage to surrounding structures, pain, infection, urine leak, and need for Foley catheter were reviewed. All of his questions were answered.  This point time, he is considering a second opinion which was ordered today, referral to [facility].  I have advised him to let us know if he like to go ahead and schedule surgery.    - Ambulatory referral to Urology  3. BPH with obstruction/lower urinary tract symptoms Given the presence of a large bladder stone, bladder outlet obstruction is suspected despite relatively small prostate volume. Given his fluctuating PSA, I do feel that its important to  assess the need for possible prostate biopsy prior to performing outlet procedure.   Additionally, suspect the majority of his symptoms are from the bladder stone itself and not from his outlet obstruction. May consider urodynamics in the future upon reassessment once the bladder stone has been adequately treated. In that regard, would recommend staged treatment for bladder stone followed by consideration of outlet procedure in the future pending the above.  4. Elevated PSA History of fluctuating PSA Most recent jump in PSA likely related to inflammation secondary to large bladder stone Recommend treatment of the bladder stone and repeat PSA to assess for normalization PSA fails to normalize, would recommend prostate biopsy  5. Nonobstructing kidney stones Asymptomatic, largest 5 mm No intervention recommended at this time Will continue to follow  Patient will call us to let us know if he like to schedule surgery. He's been referred for second opinion as requested.    Hollice Espy, MD  [facility] [address], [place], [place] [address] [phone]  I spent 25 min with this patient of which greater than 50% was spent in counseling and coordination of care with the patient.

## 2016-07-04 ENCOUNTER — Telehealth: Payer: Self-pay | Admitting: Urology

## 2016-07-04 NOTE — Telephone Encounter (Signed)
Referral faxed to [facility] at [phone] They will review and contact the patient with an app.   Sharyn Lull

## 2016-07-05 LAB — CULTURE, URINE COMPREHENSIVE

## 2016-07-07 ENCOUNTER — Other Ambulatory Visit: Payer: BLUE CROSS/BLUE SHIELD

## 2017-05-07 IMAGING — CT CT ABD-PEL WO/W CM
1 of 4 series · 8 of 32 positions shown, 13 images · IV contrast (APPLIED)
Comparison: None.

CLINICAL DATA: Microscopic hematuria. Worsening dysuria for 1 year.

EXAM:
CT ABDOMEN AND PELVIS WITHOUT AND WITH CONTRAST
TECHNIQUE: Multidetector CT imaging of the abdomen and pelvis was performed
following the standard protocol before and following the bolus
administration of intravenous contrast.
CONTRAST:  125mL [id number] IOPAMIDOL ([id number]) INJECTION 61%

[Series 13: axial delay · axial · delayed · 0.81mm/px · z∈[-855,-480]mm · 8 of 97 slices shown, 13 images]
[im 11/97  soft-tissue]
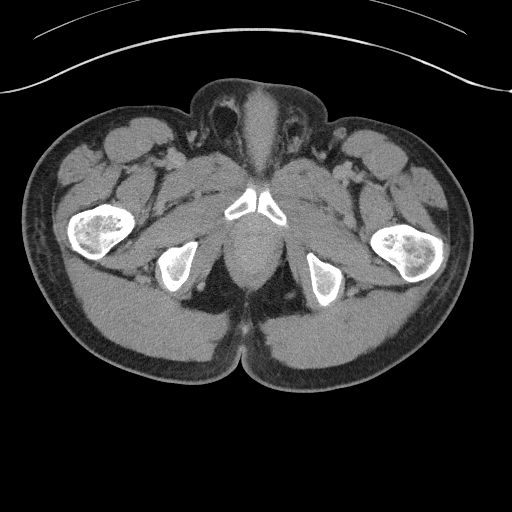
[im 11/97  bone]
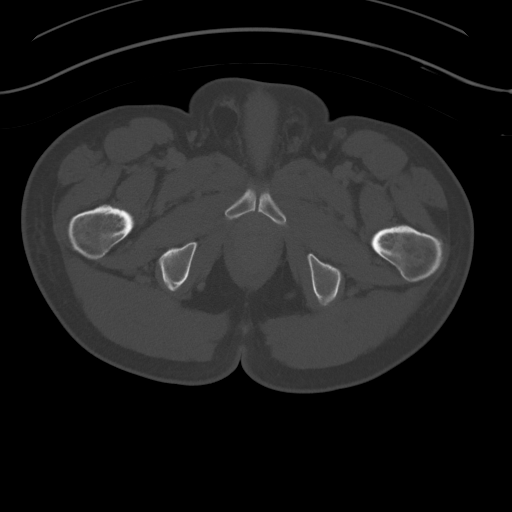
[im 22/97  soft-tissue]
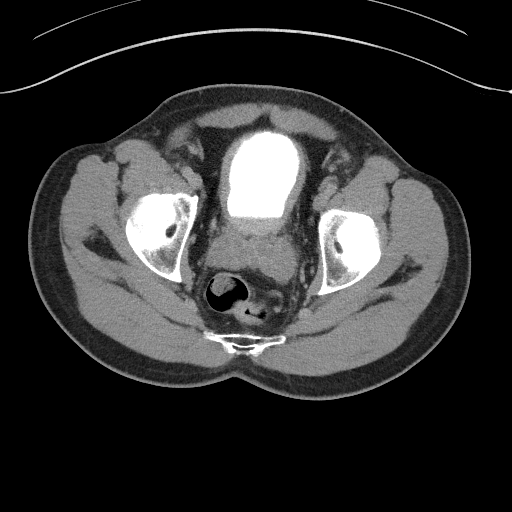
[im 33/97  soft-tissue]
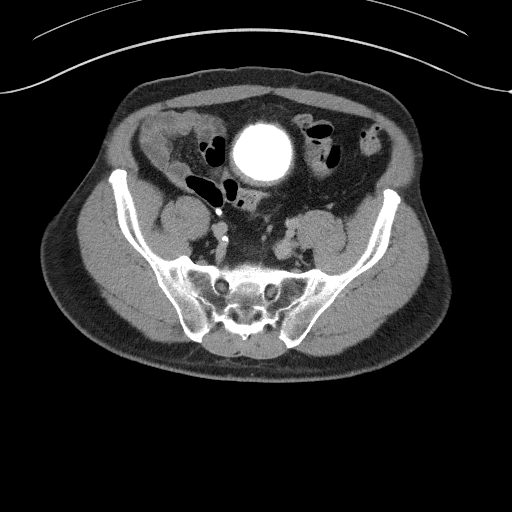
[im 43/97  soft-tissue]
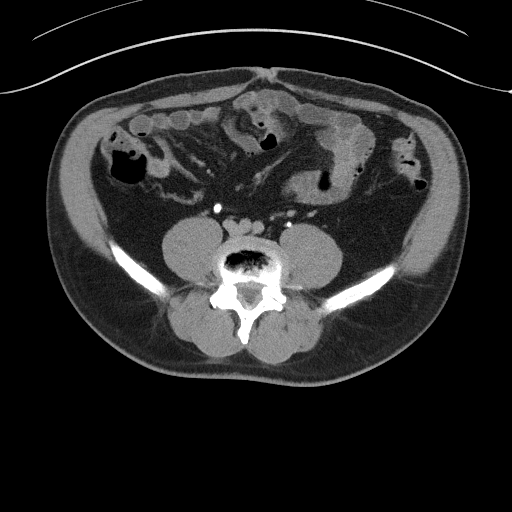
[im 54/97  soft-tissue]
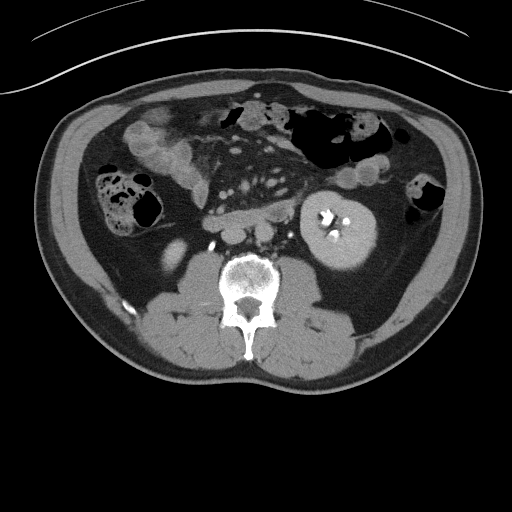
[im 54/97  lung]
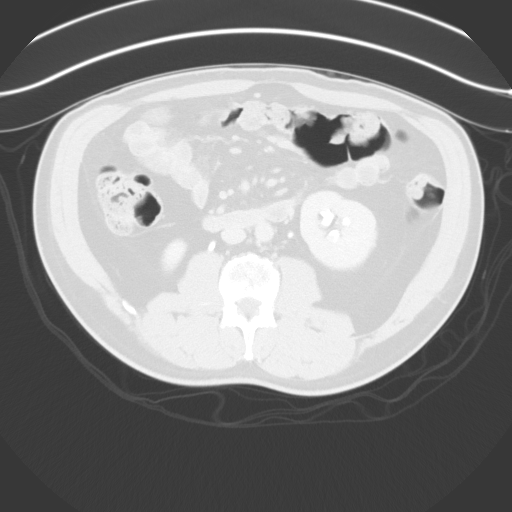
[im 65/97  soft-tissue]
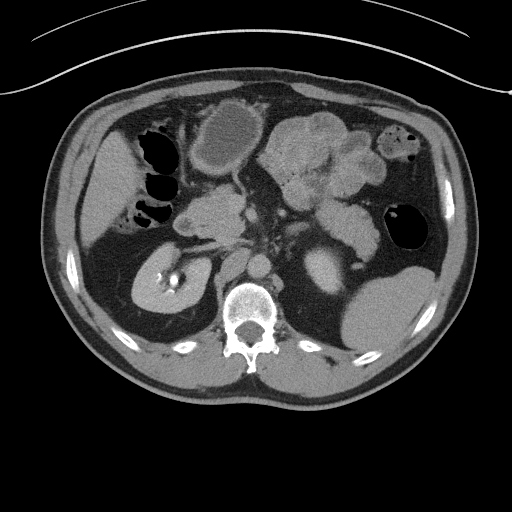
[im 65/97  lung]
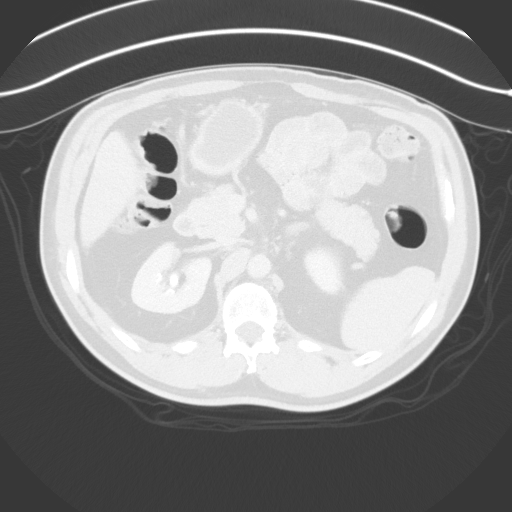
[im 75/97  soft-tissue]
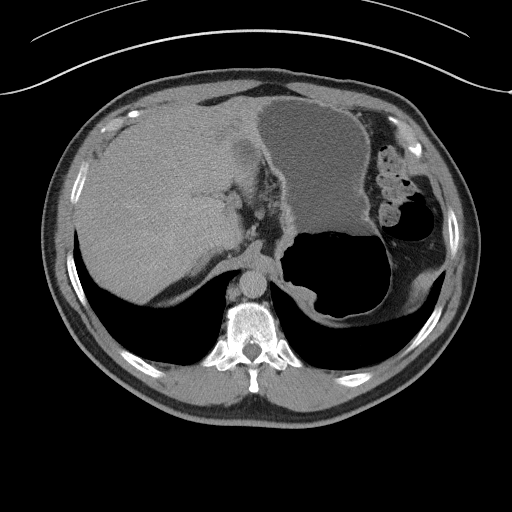
[im 75/97  lung]
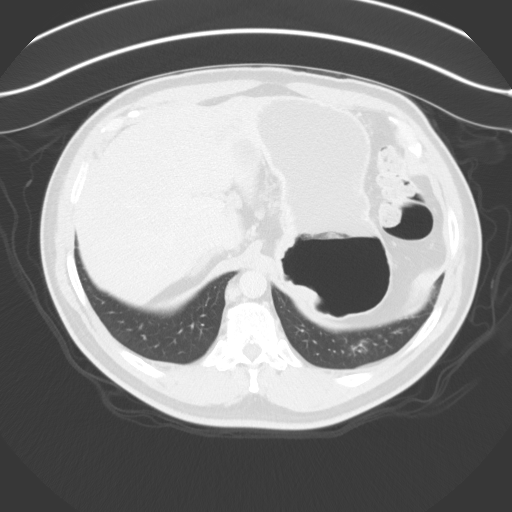
[im 86/97  soft-tissue]
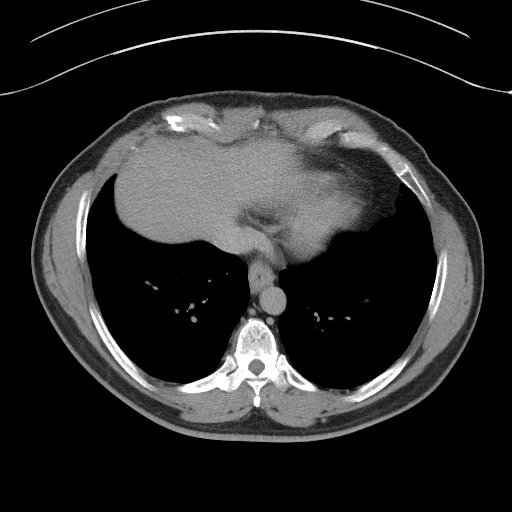
[im 86/97  lung]
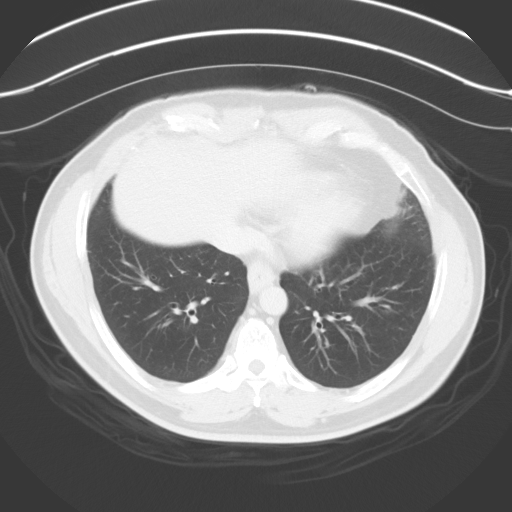

[8 of 32 positions shown; findings below may reference images not displayed]

FINDINGS: Lower Chest: No acute findings.

Hepatobiliary:  No masses identified. Gallbladder is unremarkable.

Pancreas:  No mass or inflammatory changes.

Spleen: Within normal limits in size and appearance.

Adrenals/Urinary Tract: Several small renal calculi are seen
bilaterally, largest measuring 5 mm. No evidence of ureteral calculi
or hydronephrosis. A large calculus is seen within the urinary
bladder measuring 4.7 x 4.2 cm. Diffuse bladder wall thickening is
seen, may be due to cystitis or chronic bladder outlet obstruction.

Stomach/Bowel: No evidence of obstruction, inflammatory process or
abnormal fluid collections.

Vascular/Lymphatic: No pathologically enlarged lymph nodes. No
abdominal aortic aneurysm.

Reproductive: Mildly enlarged prostate gland, which indents bladder
base.

Other:  Small bilateral inguinal hernias containing only fat.

Musculoskeletal:  No suspicious bone lesions identified.
IMPRESSION: Nonobstructing bilateral renal calculi measuring up to 5 mm. No
evidence of ureteral calculi or hydronephrosis.

Large 4.7 cm calculus within urinary bladder. Diffuse bladder wall
thickening, suspicious for cystitis or chronic bladder outlet
obstruction. Recommend correlation with cystoscopy.

No radiographic evidence of urinary tract neoplasm.

Mildly enlarged prostate.

## 2022-04-15 ENCOUNTER — Ambulatory Visit (INDEPENDENT_AMBULATORY_CARE_PROVIDER_SITE_OTHER): Payer: BC Managed Care – PPO | Admitting: Urology

## 2022-04-15 VITALS — BP 178/53 | HR 86 | Ht 68.0 in | Wt 159.0 lb

## 2022-04-15 DIAGNOSIS — N401 Enlarged prostate with lower urinary tract symptoms: Secondary | ICD-10-CM

## 2022-04-15 DIAGNOSIS — E7201 Cystinuria: Secondary | ICD-10-CM | POA: Diagnosis not present

## 2022-04-15 DIAGNOSIS — C61 Malignant neoplasm of prostate: Secondary | ICD-10-CM

## 2022-04-15 DIAGNOSIS — N200 Calculus of kidney: Secondary | ICD-10-CM

## 2022-04-15 DIAGNOSIS — R3129 Other microscopic hematuria: Secondary | ICD-10-CM | POA: Diagnosis not present

## 2022-04-15 DIAGNOSIS — R351 Nocturia: Secondary | ICD-10-CM

## 2022-04-15 LAB — MICROSCOPIC EXAMINATION

## 2022-04-15 LAB — URINALYSIS, COMPLETE
Bilirubin, UA: NEGATIVE
Glucose, UA: NEGATIVE
Ketones, UA: NEGATIVE
Nitrite, UA: NEGATIVE
RBC, UA: NEGATIVE
Specific Gravity, UA: 1.02 (ref 1.005–1.030)
Urobilinogen, Ur: 1 mg/dL (ref 0.2–1.0)
pH, UA: 6.5 (ref 5.0–7.5)

## 2022-04-15 LAB — BLADDER SCAN AMB NON-IMAGING

## 2022-04-15 NOTE — Patient Instructions (Signed)

## 2022-04-15 NOTE — Progress Notes (Signed)
04/15/2022 1:07 PM   Maida Sale [birth year] [id number]  Referring provider: Baxter Hire, MD [place],  [place] [address]  Chief Complaint  Patient presents with   Benign Prostatic Hypertrophy    HPI: 66 year old male who presents today to reestablish care.  He was last seen in 2018 with BPH and a large bladder stone.  He sought a second opinion at [facility] and has not been seen in our clinic since that time.  He ended up establishing care Dr. Sheralyn Boatman.  He was taken to the OR for cystolitholapaxy back in 2018 at which time a large bladder stone was treated.  Interestingly, bladder stone composition was 100% cystine.  He was also noted to have an elevated PSA and underwent prostate biopsy on 11/06/2016 which did confirm the presence of low risk prostate cancer, a single core of Gleason 3+4 involving only 5% of the tissue at the left base along with 2 additional cores of Gleason 3+3.  iPSA 6.37  He elected active surveillance and underwent a prostate MRI on 12/2018 which indicated the presence of BPH nodules as well as a PI-RADS 2 lesion at the left posterior mid gland to apex.  There is no active evidence of extra glandular extension or lymphadenopathy.  Incidental bilateral inguinal hernias, fat-containing as well as thickened bladder wall was appreciated.  He reports that because of COVID another barriers, he has not been seen in many years for his prostate cancer.  Most recent PSA 8.09  He is chronically on Flomax.  Arlis indicates that he is taking finasteride although on his primary care's most recent note indicates that he is not.  He thinks that he is.  Has a personal history of kidney stones, most recent cross-sectional imaging from 2018 indicate structure and stones up to 5 mm.  Denies any flank pain.    IPSS     Row Name 04/15/22 1100         International Prostate Symptom Score   How often have you had the sensation of not emptying your bladder?  Less than half the time     How often have you had to urinate less than every two hours? Less than half the time     How often have you found you stopped and started again several times when you urinated? Not at All     How often have you found it difficult to postpone urination? Less than 1 in 5 times     How often have you had a weak urinary stream? Less than 1 in 5 times     How often have you had to strain to start urination? Not at All     How many times did you typically get up at night to urinate? None     Total IPSS Score 6       Quality of Life due to urinary symptoms   If you were to spend the rest of your life with your urinary condition just the way it is now how would you feel about that? Mixed              Score:  1-7 Mild 8-19 Moderate 20-35 Severe  Results for orders placed or performed in visit on 04/15/22  Microscopic Examination   Urine  Result Value Ref Range   WBC, UA 6-10 (A) 0 - 5 /hpf   RBC, Urine 0-2 0 - 2 /hpf   Epithelial Cells (non renal) 0-10 0 -  10 /hpf   Bacteria, UA Moderate (A) None seen/Few  Urinalysis, Complete  Result Value Ref Range   Specific Gravity, UA 1.020 1.005 - 1.030   pH, UA 6.5 5.0 - 7.5   Color, UA Yellow Yellow   Appearance Ur Clear Clear   Leukocytes,UA Trace (A) Negative   Protein,UA Trace (A) Negative/Trace   Glucose, UA Negative Negative   Ketones, UA Negative Negative   RBC, UA Negative Negative   Bilirubin, UA Negative Negative   Urobilinogen, Ur 1.0 0.2 - 1.0 mg/dL   Nitrite, UA Negative Negative   Microscopic Examination See below:   Bladder Scan (Post Void Residual) in office  Result Value Ref Range   Scan Result 11ML       PMH: Past Medical History:  Diagnosis Date   Abnormal prostate specific antigen 11/21/2014   Ankle edema    Aortic heart murmur    BP (high blood pressure) 12/26/2014   BPH (benign prostatic hyperplasia)    Elevated PSA    HLD (hyperlipidemia) 12/26/2014   HTN (hypertension)     Hyperlipidemia     Surgical History: Past Surgical History:  Procedure Laterality Date   none      Home Medications:  Allergies as of 04/15/2022   No Known Allergies      Medication List        Accurate as of April 15, 2022  1:07 PM. If you have any questions, ask your nurse or doctor.          STOP taking these medications    losartan 50 MG tablet Commonly known as: COZAAR       TAKE these medications    amLODipine 2.5 MG tablet Commonly known as: NORVASC Take 2.5 mg by mouth daily.   aspirin 81 MG tablet Take 81 mg by mouth daily.   finasteride 5 MG tablet Commonly known as: PROSCAR Take 1 tablet (5 mg total) by mouth daily.   lisinopril-hydrochlorothiazide 10-12.5 MG tablet Commonly known as: ZESTORETIC Take 1 tablet by mouth daily.   metoprolol succinate 50 MG 24 hr tablet Commonly known as: TOPROL-XL Take by mouth.   pravastatin 40 MG tablet Commonly known as: PRAVACHOL Take 40 mg by mouth daily.   tamsulosin 0.4 MG Caps capsule Commonly known as: FLOMAX Take 0.4 mg by mouth. Reported on 06/29/2015        Allergies: No Known Allergies  Family History: Family History  Problem Relation Age of Onset   Stomach cancer Father    Prostate cancer Brother    Hypertension Sister    Kidney disease Neg Hx     Social History:  reports that he has quit smoking. He has never used smokeless tobacco. He reports that he does not drink alcohol and does not use drugs.   Physical Exam: BP (!) 178/53   Pulse 86   Ht '5\' 8"'$  (1.727 m)   Wt 159 lb (72.1 kg)   BMI 24.18 kg/m   Constitutional:  Alert and oriented, No acute distress. HEENT: [place] AT, moist mucus membranes.  Trachea midline, no masses. Cardiovascular: No clubbing, cyanosis, or edema. Respiratory: Normal respiratory effort, no increased work of breathing.. Neurologic: Grossly intact, no focal deficits, moving all 4 extremities. Psychiatric: Normal mood and affect.  Laboratory  Data: Lab Results  Component Value Date   CREATININE 1.17 06/12/2016    Urinalysis    Component Value Date/Time   APPEARANCEUR Clear 04/15/2022 1047   GLUCOSEU Negative 04/15/2022 1047   BILIRUBINUR Negative  04/15/2022 1047   PROTEINUR Trace (A) 04/15/2022 1047   NITRITE Negative 04/15/2022 1047   LEUKOCYTESUR Trace (A) 04/15/2022 1047    Lab Results  Component Value Date   LABMICR See below: 04/15/2022   WBCUA 6-10 (A) 04/15/2022   RBCUA >30 (A) 07/03/2016   LABEPIT 0-10 04/15/2022   MUCUS Present (A) 06/29/2015   BACTERIA Moderate (A) 04/15/2022     Assessment & Plan:    1. Prostate cancer [facility]) Personal history of intermediate risk prostate cancer diagnosed in 2018 without any recent follow-up  PSA has fluctuated somewhat but overall continues to rise  Somewhat unclear whether or not he is on finasteride which may impact how to interpret his PSA  We discussed the option of proceeding with MRI versus biopsy moving forward.  Risk and benefits of each were discussed.  He is most interested in repeat biopsy which I strongly agree with.  We discussed prostate biopsy in detail including the procedure itself, the risks of blood in the urine, stool, and ejaculate, serious infection, and discomfort. He is willing to proceed with this as discussed.  - Bladder Scan (Post Void Residual) in office  2. 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 Benign prostatic hyperplasia with nocturia Overall bother is minimal despite fairly significant nocturia  On Flomax and possibly finasteride  We will address further once his prostate cancer has been restaged/reevaluated    Return for prostate biopsy and follow up./ rectal exam  Hollice Espy, MD  [place] [address],  [place], [place] [address] [phone]  I spent 65 total minutes on the day of the encounter including pre-visit review of the medical record, face-to-face time with the patient, and post visit ordering of labs/imaging/tests.

## 2022-05-02 DIAGNOSIS — C189 Malignant neoplasm of colon, unspecified: Secondary | ICD-10-CM

## 2022-05-02 HISTORY — DX: Malignant neoplasm of colon, unspecified: C18.9

## 2022-05-06 ENCOUNTER — Other Ambulatory Visit: Payer: Self-pay | Admitting: Gastroenterology

## 2022-05-06 DIAGNOSIS — D500 Iron deficiency anemia secondary to blood loss (chronic): Secondary | ICD-10-CM

## 2022-05-06 DIAGNOSIS — K921 Melena: Secondary | ICD-10-CM

## 2022-05-06 DIAGNOSIS — C182 Malignant neoplasm of ascending colon: Secondary | ICD-10-CM

## 2022-05-08 ENCOUNTER — Encounter: Payer: Self-pay | Admitting: Oncology

## 2022-05-08 ENCOUNTER — Other Ambulatory Visit: Payer: Self-pay

## 2022-05-08 ENCOUNTER — Inpatient Hospital Stay: Payer: BC Managed Care – PPO | Attending: Oncology | Admitting: Oncology

## 2022-05-08 ENCOUNTER — Inpatient Hospital Stay: Payer: BC Managed Care – PPO

## 2022-05-08 VITALS — BP 154/64 | HR 73 | Temp 99.7°F | Resp 18 | Ht 68.0 in | Wt 162.5 lb

## 2022-05-08 DIAGNOSIS — N400 Enlarged prostate without lower urinary tract symptoms: Secondary | ICD-10-CM | POA: Insufficient documentation

## 2022-05-08 DIAGNOSIS — Z7982 Long term (current) use of aspirin: Secondary | ICD-10-CM | POA: Diagnosis not present

## 2022-05-08 DIAGNOSIS — I10 Essential (primary) hypertension: Secondary | ICD-10-CM | POA: Insufficient documentation

## 2022-05-08 DIAGNOSIS — D649 Anemia, unspecified: Secondary | ICD-10-CM

## 2022-05-08 DIAGNOSIS — D500 Iron deficiency anemia secondary to blood loss (chronic): Secondary | ICD-10-CM

## 2022-05-08 DIAGNOSIS — D509 Iron deficiency anemia, unspecified: Secondary | ICD-10-CM | POA: Diagnosis not present

## 2022-05-08 DIAGNOSIS — R972 Elevated prostate specific antigen [PSA]: Secondary | ICD-10-CM

## 2022-05-08 DIAGNOSIS — Z8042 Family history of malignant neoplasm of prostate: Secondary | ICD-10-CM | POA: Insufficient documentation

## 2022-05-08 DIAGNOSIS — Z800 Family history of malignant neoplasm of digestive organs: Secondary | ICD-10-CM | POA: Insufficient documentation

## 2022-05-08 DIAGNOSIS — Z87891 Personal history of nicotine dependence: Secondary | ICD-10-CM | POA: Diagnosis not present

## 2022-05-08 DIAGNOSIS — Z79899 Other long term (current) drug therapy: Secondary | ICD-10-CM | POA: Insufficient documentation

## 2022-05-08 DIAGNOSIS — C182 Malignant neoplasm of ascending colon: Secondary | ICD-10-CM | POA: Diagnosis present

## 2022-05-08 DIAGNOSIS — Z7189 Other specified counseling: Secondary | ICD-10-CM

## 2022-05-08 DIAGNOSIS — Z86010 Personal history of colonic polyps: Secondary | ICD-10-CM | POA: Insufficient documentation

## 2022-05-08 DIAGNOSIS — E785 Hyperlipidemia, unspecified: Secondary | ICD-10-CM | POA: Insufficient documentation

## 2022-05-08 LAB — COMPREHENSIVE METABOLIC PANEL
ALT: 13 U/L (ref 0–44)
AST: 17 U/L (ref 15–41)
Albumin: 3.8 g/dL (ref 3.5–5.0)
Alkaline Phosphatase: 81 U/L (ref 38–126)
Anion gap: 8 (ref 5–15)
BUN: 12 mg/dL (ref 8–23)
CO2: 25 mmol/L (ref 22–32)
Calcium: 8.8 mg/dL — ABNORMAL LOW (ref 8.9–10.3)
Chloride: 101 mmol/L (ref 98–111)
Creatinine, Ser: 0.92 mg/dL (ref 0.61–1.24)
GFR, Estimated: >60 mL/min (ref >60)
Glucose, Bld: 151 mg/dL — ABNORMAL HIGH (ref 70–99)
Potassium: 3.5 mmol/L (ref 3.5–5.1)
Sodium: 134 mmol/L — ABNORMAL LOW (ref 135–145)
Total Bilirubin: 0.4 mg/dL (ref 0.3–1.2)
Total Protein: 7.7 g/dL (ref 6.5–8.1)

## 2022-05-08 LAB — CBC WITH DIFFERENTIAL/PLATELET
Abs Immature Granulocytes: 0.06 10*3/uL (ref 0.00–0.07)
Basophils Absolute: 0.1 10*3/uL (ref 0.0–0.1)
Basophils Relative: 1 %
Eosinophils Absolute: 0.2 10*3/uL (ref 0.0–0.5)
Eosinophils Relative: 3 %
HCT: 26.8 % — ABNORMAL LOW (ref 39.0–52.0)
Hemoglobin: 7.9 g/dL — ABNORMAL LOW (ref 13.0–17.0)
Immature Granulocytes: 1 %
Lymphocytes Relative: 29 %
Lymphs Abs: 1.9 10*3/uL (ref 0.7–4.0)
MCH: 20.8 pg — ABNORMAL LOW (ref 26.0–34.0)
MCHC: 29.5 g/dL — ABNORMAL LOW (ref 30.0–36.0)
MCV: 70.7 fL — ABNORMAL LOW (ref 80.0–100.0)
Monocytes Absolute: 0.5 10*3/uL (ref 0.1–1.0)
Monocytes Relative: 8 %
Neutro Abs: 3.9 10*3/uL (ref 1.7–7.7)
Neutrophils Relative %: 58 %
Platelets: 444 10*3/uL — ABNORMAL HIGH (ref 150–400)
RBC: 3.79 MIL/uL — ABNORMAL LOW (ref 4.22–5.81)
RDW: 16.2 % — ABNORMAL HIGH (ref 11.5–15.5)
WBC: 6.6 10*3/uL (ref 4.0–10.5)
nRBC: 0 % (ref 0.0–0.2)

## 2022-05-08 LAB — RETIC PANEL
Immature Retic Fract: 18.7 % — ABNORMAL HIGH (ref 2.3–15.9)
RBC.: 3.79 MIL/uL — ABNORMAL LOW (ref 4.22–5.81)
Retic Count, Absolute: 62.9 10*3/uL (ref 19.0–186.0)
Retic Ct Pct: 1.7 % (ref 0.4–3.1)
Reticulocyte Hemoglobin: 21.7 pg — ABNORMAL LOW (ref >27.9)

## 2022-05-08 LAB — IRON AND TIBC
Iron: 35 ug/dL — ABNORMAL LOW (ref 45–182)
Saturation Ratios: 9 % — ABNORMAL LOW (ref 17.9–39.5)
TIBC: 396 ug/dL (ref 250–450)
UIBC: 361 ug/dL

## 2022-05-08 LAB — FERRITIN: Ferritin: 3 ng/mL — ABNORMAL LOW (ref 24–336)

## 2022-05-08 NOTE — Progress Notes (Signed)
Met with Todd Trevino. Introduced Therapist, nutritional and provided my contact information for future needs. CT scan scheduled for 05/09/22. He will be contacted with results and treatment plan. Escorted to the lab.

## 2022-05-09 ENCOUNTER — Encounter: Payer: Self-pay | Admitting: Oncology

## 2022-05-09 ENCOUNTER — Ambulatory Visit: Admission: RE | Admit: 2022-05-09 | Payer: BC Managed Care – PPO | Source: Ambulatory Visit

## 2022-05-09 DIAGNOSIS — Z7189 Other specified counseling: Secondary | ICD-10-CM | POA: Insufficient documentation

## 2022-05-09 DIAGNOSIS — D509 Iron deficiency anemia, unspecified: Secondary | ICD-10-CM | POA: Insufficient documentation

## 2022-05-09 LAB — CEA: CEA: 33.9 ng/mL — ABNORMAL HIGH (ref 0.0–4.7)

## 2022-05-09 NOTE — Assessment & Plan Note (Signed)
Diagnosis of ascending colon adenocarcinoma was reviewed and discussed with patient. I recommend staging imaging with CT chest abdomen pelvis with contrast. Check CBC, CMP, CEA. If patient has distant metastasis, treatment will be with palliative intent, primarily systemic chemotherapy If patient early stage cancer, recommend patient to establish care with surgery for further evaluation.  Adjuvant therapy will be determined after obtaining pathology staging.

## 2022-05-09 NOTE — Assessment & Plan Note (Signed)
Patient does not tolerate oral iron segmentation well. Alternative option of proceed with IV Venofer treatments. I discussed about the potential risks including but not limited to allergic reactions/infusion reactions including anaphylactic reactions, phlebitis, high blood pressure, wheezing, SOB, skin rash, weight gain, leg swelling, headache, nausea and fatigue, etc. patient tolerates oral iron supplementation poorly and he is in agreement with proceeding with IV Venofer treatment if needed.  Labs obtained today is consistent with severe iron deficiency anemia, hemoglobin 7.9 Recommend IV Venofer x 5 doses.

## 2022-05-09 NOTE — Progress Notes (Signed)
Hematology/Oncology Consult Note [phone] [phone]     REFERRING PROVIDER: Geanie Kenning, PA*   Patient Care Team: Baxter Hire, MD as PCP - General (Internal Medicine) Clent Jacks, RN as Oncology Nurse Navigator  CHIEF COMPLAINTS/PURPOSE OF CONSULTATION:  Ascending colon cancer  HISTORY OF PRESENTING ILLNESS:  Todd Trevino 66 y.o. male presents to establish care for ascending colon cancer. I have reviewed his chart and materials related to his cancer extensively and collaborated history with the patient. Summary of oncologic history is as follows: Oncology History  Cancer of ascending colon ([place])  05/08/2022 Initial Diagnosis   Cancer of ascending colon ([place])  -patient has iron deficiency anemia and had GI work up.  -Colonoscopy showed nonbleeding internal hemorrhoids.  12 mm polyp in the cecum.  Removed.  Likely malignant partially obstructing tumor in the mid ascending colon. -Pathology after ascending colon mass biopsy showed moderately differentiated adenocarcinoma with mucinous features.  A 12 mm cecum polyp biopsy showed tubular adenoma.    05/08/2022 Cancer Staging   Staging form: Colon and Rectum - Neuroendocine Tumors, AJCC 8th Edition - Clinical stage from 05/08/2022: Stage Unknown (cTX, cNX) - Signed by Earlie Server, MD on 05/08/2022 Stage prefix: Initial diagnosis   05/13/2022 Imaging   CT chest abdomen pelvis with contrast pending   Patient has had intermittent blood in the stool.  For iron deficiency anemia, patient has tried oral iron supplementation and not able to tolerate. He denies any nausea vomiting diarrhea, abdominal pain, unintentional weight loss.   MEDICAL HISTORY:  Past Medical History:  Diagnosis Date   Abnormal prostate specific antigen 11/21/2014   Ankle edema    Aortic heart murmur    BP (high blood pressure) 12/26/2014   BPH (benign prostatic hyperplasia)    Elevated PSA    HLD (hyperlipidemia)  12/26/2014   HTN (hypertension)    Hyperlipidemia     SURGICAL HISTORY: Past Surgical History:  Procedure Laterality Date   none      SOCIAL HISTORY: Social History   Socioeconomic History   Marital status: Married    Spouse name: Not on file   Number of children: Not on file   Years of education: Not on file   Highest education level: Not on file  Occupational History   Not on file  Tobacco Use   Smoking status: Former    Packs/day: 0.50    Years: 3.00    Total pack years: 1.50    Types: Cigarettes    Quit date: 19    Years since quitting: 40.9   Smokeless tobacco: Never   Tobacco comments:    quit 35 years  Vaping Use   Vaping Use: Never used  Substance and Sexual Activity   Alcohol use: No    Alcohol/week: 0.0 standard drinks of alcohol   Drug use: No   Sexual activity: Not on file  Other Topics Concern   Not on file  Social History Narrative   Not on file   Social Determinants of Health   Financial Resource Strain: Not on file  Food Insecurity: Not on file  Transportation Needs: Not on file  Physical Activity: Not on file  Stress: Not on file  Social Connections: Not on file  Intimate Partner Violence: Not on file    FAMILY HISTORY: Family History  Problem Relation Age of Onset   Hypertension Mother    Hypertension Father    Stomach cancer Father    Hypertension Sister  Prostate cancer Brother    Kidney disease Neg Hx     ALLERGIES:  has No Known Allergies.  MEDICATIONS:  Current Outpatient Medications  Medication Sig Dispense Refill   amLODipine (NORVASC) 2.5 MG tablet Take 2.5 mg by mouth daily.     aspirin 81 MG tablet Take 81 mg by mouth daily.     finasteride (PROSCAR) 5 MG tablet Take 1 tablet (5 mg total) by mouth daily. 30 tablet 11   lisinopril-hydrochlorothiazide (PRINZIDE,ZESTORETIC) 10-12.5 MG per tablet Take 1 tablet by mouth daily.     metoprolol succinate (TOPROL-XL) 50 MG 24 hr tablet Take by mouth.     pravastatin  (PRAVACHOL) 40 MG tablet Take 40 mg by mouth daily.     tamsulosin (FLOMAX) 0.4 MG CAPS capsule Take 0.4 mg by mouth. Reported on 06/29/2015     No current facility-administered medications for this visit.    Review of Systems  Constitutional:  Positive for fatigue. Negative for appetite change, chills, fever and unexpected weight change.  HENT:   Negative for hearing loss and voice change.   Eyes:  Negative for eye problems and icterus.  Respiratory:  Negative for chest tightness, cough and shortness of breath.   Cardiovascular:  Negative for chest pain and leg swelling.  Gastrointestinal:  Negative for abdominal distention and abdominal pain.  Endocrine: Negative for hot flashes.  Genitourinary:  Negative for difficulty urinating, dysuria and frequency.   Musculoskeletal:  Negative for arthralgias.  Skin:  Negative for itching and rash.  Neurological:  Negative for light-headedness and numbness.  Hematological:  Negative for adenopathy. Does not bruise/bleed easily.  Psychiatric/Behavioral:  Negative for confusion.      PHYSICAL EXAMINATION: ECOG PERFORMANCE STATUS: 0 - Asymptomatic  Vitals:   05/08/22 1056  BP: (!) 154/64  Pulse: 73  Resp: 18  Temp: 99.7 F (37.6 C)   Filed Weights   05/08/22 1056  Weight: 162 lb 8 oz (73.7 kg)    Physical Exam Constitutional:      General: He is not in acute distress.    Appearance: He is not diaphoretic.  HENT:     Head: Normocephalic and atraumatic.     Nose: Nose normal.     Mouth/Throat:     Pharynx: No oropharyngeal exudate.  Eyes:     General: No scleral icterus.    Pupils: Pupils are equal, round, and reactive to light.  Cardiovascular:     Rate and Rhythm: Normal rate and regular rhythm.     Heart sounds: No murmur heard. Pulmonary:     Effort: Pulmonary effort is normal. No respiratory distress.     Breath sounds: No rales.  Chest:     Chest wall: No tenderness.  Abdominal:     General: There is no distension.      Palpations: Abdomen is soft.     Tenderness: There is no abdominal tenderness.  Musculoskeletal:        General: Normal range of motion.     Cervical back: Normal range of motion and neck supple.  Skin:    General: Skin is warm and dry.     Findings: No erythema.  Neurological:     Mental Status: He is alert and oriented to person, place, and time.     Cranial Nerves: No cranial nerve deficit.     Motor: No abnormal muscle tone.     Coordination: Coordination normal.  Psychiatric:        Mood and Affect: Affect normal.  LABORATORY DATA:  I have reviewed the data as listed    Latest Ref Rng & Units 05/08/2022   11:41 AM  CBC  WBC 4.0 - 10.5 K/uL 6.6   Hemoglobin 13.0 - 17.0 g/dL 7.9   Hematocrit 39.0 - 52.0 % 26.8   Platelets 150 - 400 K/uL 444       Latest Ref Rng & Units 05/08/2022   11:41 AM 06/12/2016    4:27 PM  CMP  Glucose 70 - 99 mg/dL 151    BUN 8 - 23 mg/dL 12  16   Creatinine 0.61 - 1.24 mg/dL 0.92  1.17   Sodium 135 - 145 mmol/L 134    Potassium 3.5 - 5.1 mmol/L 3.5    Chloride 98 - 111 mmol/L 101    CO2 22 - 32 mmol/L 25    Calcium 8.9 - 10.3 mg/dL 8.8    Total Protein 6.5 - 8.1 g/dL 7.7    Total Bilirubin 0.3 - 1.2 mg/dL 0.4    Alkaline Phos 38 - 126 U/L 81    AST 15 - 41 U/L 17    ALT 0 - 44 U/L 13       RADIOGRAPHIC STUDIES: I have personally reviewed the radiological images as listed and agreed with the findings in the report. No results found.  ASSESSMENT & PLAN:  Cancer of ascending colon ([place]) Diagnosis of ascending colon adenocarcinoma was reviewed and discussed with patient. I recommend staging imaging with CT chest abdomen pelvis with contrast. Check CBC, CMP, CEA. If patient has distant metastasis, treatment will be with palliative intent, primarily systemic chemotherapy If patient early stage cancer, recommend patient to establish care with surgery for further evaluation.  Adjuvant therapy will be determined after obtaining  pathology staging.  Iron deficiency anemia Patient does not tolerate oral iron segmentation well. Alternative option of proceed with IV Venofer treatments. I discussed about the potential risks including but not limited to allergic reactions/infusion reactions including anaphylactic reactions, phlebitis, high blood pressure, wheezing, SOB, skin rash, weight gain, leg swelling, headache, nausea and fatigue, etc. patient tolerates oral iron supplementation poorly and he is in agreement with proceeding with IV Venofer treatment if needed.  Labs obtained today is consistent with severe iron deficiency anemia, hemoglobin 7.9 Recommend IV Venofer x 5 doses.  Goals of care, counseling/discussion Discussed with patient.     Orders Placed This Encounter  Procedures   CBC with Differential/Platelet    Standing Status:   Future    Number of Occurrences:   1    Standing Expiration Date:   05/09/2023   Comprehensive metabolic panel    Standing Status:   Future    Number of Occurrences:   1    Standing Expiration Date:   05/08/2023   Iron and TIBC    Standing Status:   Future    Number of Occurrences:   1    Standing Expiration Date:   05/09/2023   Ferritin    Standing Status:   Future    Number of Occurrences:   1    Standing Expiration Date:   05/09/2023   Retic Panel    Standing Status:   Future    Number of Occurrences:   1    Standing Expiration Date:   05/09/2023   CEA    Standing Status:   Future    Number of Occurrences:   1    Standing Expiration Date:   05/08/2023    Follow up  TBD  All questions were answered. The patient knows to call the clinic with any problems, questions or concerns. No barriers to learning was detected.  Earlie Server, MD 05/08/2022

## 2022-05-09 NOTE — Assessment & Plan Note (Signed)
Discussed with patient

## 2022-05-12 ENCOUNTER — Telehealth: Payer: Self-pay

## 2022-05-12 NOTE — Telephone Encounter (Signed)
-----   Message from Earlie Server, MD sent at 05/09/2022  8:27 PM EST ----- Please let patient know that his iron level is very low.  I recommend IV Venofer treatments.  Please arrange patient to get IV Venofer twice a week for total 5 doses.  3 days apart.  Thank you

## 2022-05-12 NOTE — Telephone Encounter (Signed)
Pt informed of MD recommendation and is ok with proceeding with IV venofer.   Please schedule and inform pt :  IV venofer twice a week for a total of 5 doses (at least 3 days apart)

## 2022-05-13 ENCOUNTER — Ambulatory Visit: Admission: RE | Admit: 2022-05-13 | Payer: BC Managed Care – PPO | Source: Ambulatory Visit

## 2022-05-13 ENCOUNTER — Other Ambulatory Visit: Payer: BLUE CROSS/BLUE SHIELD | Admitting: Urology

## 2022-05-13 NOTE — Progress Notes (Signed)
Noted that CT has been rescheduled again due to no insurance authorization from ordering provider. Rescheduled to 05/21/22.

## 2022-05-14 ENCOUNTER — Encounter: Payer: Self-pay | Admitting: Oncology

## 2022-05-19 ENCOUNTER — Inpatient Hospital Stay: Payer: BC Managed Care – PPO

## 2022-05-19 VITALS — BP 122/59 | HR 58 | Temp 99.1°F | Resp 18

## 2022-05-19 DIAGNOSIS — C182 Malignant neoplasm of ascending colon: Secondary | ICD-10-CM | POA: Diagnosis not present

## 2022-05-19 DIAGNOSIS — D500 Iron deficiency anemia secondary to blood loss (chronic): Secondary | ICD-10-CM

## 2022-05-19 MED ORDER — SODIUM CHLORIDE 0.9 % IV SOLN
200.0000 mg | Freq: Once | INTRAVENOUS | Status: AC
  Administered 2022-05-19: 200 mg via INTRAVENOUS
  Filled 2022-05-19: qty 200

## 2022-05-19 MED ORDER — SODIUM CHLORIDE 0.9 % IV SOLN
Freq: Once | INTRAVENOUS | Status: AC
  Filled 2022-05-19: qty 250

## 2022-05-19 NOTE — Patient Instructions (Signed)
[facility] CANCER CTR AT [place]  Discharge Instructions: Thank you for choosing [facility] to provide your oncology and hematology care.  If you have a lab appointment with the [facility], please go directly to the [facility] and check in at the registration area.  Wear comfortable clothing and clothing appropriate for easy access to any Portacath or PICC line.   We strive to give you quality time with your provider. You may need to reschedule your appointment if you arrive late (15 or more minutes).  Arriving late affects you and other patients whose appointments are after yours.  Also, if you miss three or more appointments without notifying the office, you may be dismissed from the clinic at the provider's discretion.      For prescription refill requests, have your pharmacy contact our office and allow 72 hours for refills to be completed.    Today you received the following chemotherapy and/or immunotherapy agents Venofer.      To help prevent nausea and vomiting after your treatment, we encourage you to take your nausea medication as directed.  BELOW ARE SYMPTOMS THAT SHOULD BE REPORTED IMMEDIATELY: *FEVER GREATER THAN 100.4 F (38 C) OR HIGHER *CHILLS OR SWEATING *NAUSEA AND VOMITING THAT IS NOT CONTROLLED WITH YOUR NAUSEA MEDICATION *UNUSUAL SHORTNESS OF BREATH *UNUSUAL BRUISING OR BLEEDING *URINARY PROBLEMS (pain or burning when urinating, or frequent urination) *BOWEL PROBLEMS (unusual diarrhea, constipation, pain near the anus) TENDERNESS IN MOUTH AND THROAT WITH OR WITHOUT PRESENCE OF ULCERS (sore throat, sores in mouth, or a toothache) UNUSUAL RASH, SWELLING OR PAIN  UNUSUAL VAGINAL DISCHARGE OR ITCHING   Items with * indicate a potential emergency and should be followed up as soon as possible or go to the Emergency Department if any problems should occur.  Please show the CHEMOTHERAPY ALERT CARD or IMMUNOTHERAPY ALERT CARD at check-in to  the Emergency Department and triage nurse.  Should you have questions after your visit or need to cancel or reschedule your appointment, please contact [facility] CANCER [place] AT [place]  [phone] and follow the prompts.  Office hours are 8:00 a.m. to 4:30 p.m. Monday - Friday. Please note that voicemails left after 4:00 p.m. may not be returned until the following business day.  We are closed weekends and major holidays. You have access to a nurse at all times for urgent questions. Please call the main number to the clinic [phone] and follow the prompts.  For any non-urgent questions, you may also contact your provider using MyChart. We now offer e-Visits for anyone 12 and older to request care online for non-urgent symptoms. For details visit [email or web address].   Also download the MyChart app! Go to the app store, search "MyChart", open the app, select [facility], and log in with your MyChart username and password.  Masks are optional in the cancer centers. If you would like for your care team to wear a mask while they are taking care of you, please let them know. For doctor visits, patients may have with them one support person who is at least 68 years old. At this time, visitors are not allowed in the infusion area.

## 2022-05-20 ENCOUNTER — Encounter: Payer: Self-pay | Admitting: Oncology

## 2022-05-21 ENCOUNTER — Ambulatory Visit: Payer: BLUE CROSS/BLUE SHIELD | Admitting: Urology

## 2022-05-21 ENCOUNTER — Inpatient Hospital Stay (HOSPITAL_BASED_OUTPATIENT_CLINIC_OR_DEPARTMENT_OTHER): Payer: BC Managed Care – PPO | Admitting: Hospice and Palliative Medicine

## 2022-05-21 ENCOUNTER — Ambulatory Visit
Admission: RE | Admit: 2022-05-21 | Discharge: 2022-05-21 | Disposition: A | Discharge status: Home / Self Care | Payer: BC Managed Care – PPO | Source: Ambulatory Visit | Attending: Gastroenterology | Admitting: Gastroenterology

## 2022-05-21 DIAGNOSIS — C182 Malignant neoplasm of ascending colon: Secondary | ICD-10-CM

## 2022-05-21 DIAGNOSIS — K921 Melena: Secondary | ICD-10-CM | POA: Insufficient documentation

## 2022-05-21 DIAGNOSIS — D500 Iron deficiency anemia secondary to blood loss (chronic): Secondary | ICD-10-CM | POA: Insufficient documentation

## 2022-05-21 MED ORDER — IOHEXOL 300 MG/ML  SOLN
85.0000 mL | Freq: Once | INTRAMUSCULAR | Status: AC | PRN
  Administered 2022-05-21: 85 mL via INTRAVENOUS

## 2022-05-21 NOTE — Progress Notes (Signed)
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 The [facility] considered available treatment options and made the following recommendations/referrals:  Nutrition  The MOC is a meeting of clinicians from various specialty areas who evaluate and discuss patients for whom a multidisciplinary approach is being considered. Final determinations in the plan of care are those of the provider(s).   Today's extended care, comprehensive team conference, Nikolai was not present for the discussion and was not examined.

## 2022-05-22 ENCOUNTER — Inpatient Hospital Stay: Payer: BC Managed Care – PPO

## 2022-05-22 VITALS — BP 113/54 | HR 52 | Temp 97.9°F | Resp 16

## 2022-05-22 DIAGNOSIS — C182 Malignant neoplasm of ascending colon: Secondary | ICD-10-CM | POA: Diagnosis not present

## 2022-05-22 DIAGNOSIS — D500 Iron deficiency anemia secondary to blood loss (chronic): Secondary | ICD-10-CM

## 2022-05-22 MED ORDER — SODIUM CHLORIDE 0.9 % IV SOLN
Freq: Once | INTRAVENOUS | Status: AC
  Filled 2022-05-22: qty 250

## 2022-05-22 MED ORDER — SODIUM CHLORIDE 0.9 % IV SOLN
200.0000 mg | Freq: Once | INTRAVENOUS | Status: AC
  Administered 2022-05-22: 200 mg via INTRAVENOUS
  Filled 2022-05-22: qty 200

## 2022-05-27 ENCOUNTER — Inpatient Hospital Stay: Payer: BC Managed Care – PPO

## 2022-05-27 VITALS — BP 115/62 | HR 60 | Temp 98.0°F | Resp 17

## 2022-05-27 DIAGNOSIS — C182 Malignant neoplasm of ascending colon: Secondary | ICD-10-CM | POA: Diagnosis not present

## 2022-05-27 DIAGNOSIS — D500 Iron deficiency anemia secondary to blood loss (chronic): Secondary | ICD-10-CM

## 2022-05-27 MED ORDER — SODIUM CHLORIDE 0.9 % IV SOLN
Freq: Once | INTRAVENOUS | Status: AC
  Filled 2022-05-27: qty 250

## 2022-05-27 MED ORDER — SODIUM CHLORIDE 0.9% FLUSH
10.0000 mL | Freq: Once | INTRAVENOUS | Status: AC | PRN
  Administered 2022-05-27: 10 mL
  Filled 2022-05-27: qty 10

## 2022-05-27 MED ORDER — SODIUM CHLORIDE 0.9 % IV SOLN
200.0000 mg | Freq: Once | INTRAVENOUS | Status: AC
  Administered 2022-05-27: 200 mg via INTRAVENOUS
  Filled 2022-05-27: qty 200

## 2022-05-28 ENCOUNTER — Telehealth: Payer: Self-pay

## 2022-05-28 DIAGNOSIS — C182 Malignant neoplasm of ascending colon: Secondary | ICD-10-CM

## 2022-05-28 NOTE — Telephone Encounter (Signed)
MRI order entered. Will send message to scheduling.

## 2022-05-28 NOTE — Telephone Encounter (Signed)
Please schedule MRI abodmen (first available) and inform pt of appt.

## 2022-05-28 NOTE — Telephone Encounter (Signed)
Called Todd Trevino with the results of his CT scan. He is still very hesitant to see surgery. He is allowing referral to be sent and this was done. Dr. Tasia Catchings is recommending an MRI of adrenal lesion. He was notified and will be contacted once scheduled. He was encouraged to call with any questions.

## 2022-05-30 ENCOUNTER — Inpatient Hospital Stay: Payer: BC Managed Care – PPO

## 2022-05-30 VITALS — BP 136/58 | HR 71 | Temp 98.4°F | Resp 16

## 2022-05-30 DIAGNOSIS — C182 Malignant neoplasm of ascending colon: Secondary | ICD-10-CM | POA: Diagnosis not present

## 2022-05-30 DIAGNOSIS — D500 Iron deficiency anemia secondary to blood loss (chronic): Secondary | ICD-10-CM

## 2022-05-30 MED ORDER — SODIUM CHLORIDE 0.9 % IV SOLN
200.0000 mg | Freq: Once | INTRAVENOUS | Status: AC
  Administered 2022-05-30: 200 mg via INTRAVENOUS
  Filled 2022-05-30: qty 200

## 2022-05-30 MED ORDER — SODIUM CHLORIDE 0.9 % IV SOLN
Freq: Once | INTRAVENOUS | Status: AC
  Filled 2022-05-30: qty 250

## 2022-05-30 NOTE — Patient Instructions (Signed)

## 2022-05-30 NOTE — Progress Notes (Signed)
Pt tolerated treatment without complaints.  VSS.  Pt refused 30 minutes post observation period.

## 2022-06-04 ENCOUNTER — Inpatient Hospital Stay: Payer: BC Managed Care – PPO | Attending: Oncology

## 2022-06-04 ENCOUNTER — Inpatient Hospital Stay: Payer: BC Managed Care – PPO

## 2022-06-04 ENCOUNTER — Encounter: Payer: Self-pay | Admitting: Oncology

## 2022-06-04 VITALS — BP 144/61 | HR 65 | Temp 97.8°F | Resp 16

## 2022-06-04 DIAGNOSIS — E785 Hyperlipidemia, unspecified: Secondary | ICD-10-CM | POA: Insufficient documentation

## 2022-06-04 DIAGNOSIS — I10 Essential (primary) hypertension: Secondary | ICD-10-CM | POA: Insufficient documentation

## 2022-06-04 DIAGNOSIS — D500 Iron deficiency anemia secondary to blood loss (chronic): Secondary | ICD-10-CM

## 2022-06-04 DIAGNOSIS — D509 Iron deficiency anemia, unspecified: Secondary | ICD-10-CM | POA: Insufficient documentation

## 2022-06-04 DIAGNOSIS — Z79899 Other long term (current) drug therapy: Secondary | ICD-10-CM | POA: Insufficient documentation

## 2022-06-04 DIAGNOSIS — C182 Malignant neoplasm of ascending colon: Secondary | ICD-10-CM | POA: Insufficient documentation

## 2022-06-04 MED ORDER — SODIUM CHLORIDE 0.9 % IV SOLN
200.0000 mg | Freq: Once | INTRAVENOUS | Status: AC
  Administered 2022-06-04: 200 mg via INTRAVENOUS
  Filled 2022-06-04: qty 200

## 2022-06-04 MED ORDER — SODIUM CHLORIDE 0.9 % IV SOLN
Freq: Once | INTRAVENOUS | Status: AC
  Filled 2022-06-04: qty 250

## 2022-06-04 NOTE — Progress Notes (Signed)
Pt refused 30 minute post observation period.  No complaints, VSS.

## 2022-06-04 NOTE — Progress Notes (Signed)
Nutrition Assessment   Reason for Assessment:  Referral from [facility]   ASSESSMENT:   67 year old male with colon cancer.  Treatment plan not decided at this time.  MRI pending.  Past medical history of Fe deficiency anemia, HTN, HLD.  Patient receiving venofer.  Met with patient during infusion.  Patient reports that his appetite is mostly normal.  Has cut back on sweets and junk foods because tends to make abdominal pain worse/more sharp.  Denies constipation, diarrhea, nausea.  Works 3rd shift.  Breakfast is usually around 8am and will have chicken biscuit or french toast or grits and toast with hot lemon juice and water.  12:30-1pm will have mostly chicken with vegetables or couple sides.  Around 2 am usually has yogurt and cookie.     Medications: reviewed   Labs: Na 134, glucose 151   Anthropometrics:   Height: 68 inches Weight: 162 lb 8 oz 12/7 UBW: 165 lb per patient (was trying to loose weight but just continued to come down) BMI: 24  2% weight loss in the last 6 months per patient report  NUTRITION DIAGNOSIS: Inadequate oral intake related to stomach pain causing alteration in diet as evidenced by 2% weight loss    INTERVENTION:  Encouraged good sources of protein at each meal/snack Encouraged patient to add in snack/increase portion size if able to add more calories and protein to increase weight.  Patient wanting to improve weight Treatment plan not finalized at this time Contact information provided   MONITORING, EVALUATION, GOAL: weight trends, intake   Next Visit: as needed based on poc  Todd Trevino, [place], [place] Registered Dietitian [phone]

## 2022-06-04 NOTE — Patient Instructions (Signed)

## 2022-06-06 ENCOUNTER — Telehealth: Payer: Self-pay | Admitting: Oncology

## 2022-06-06 NOTE — Telephone Encounter (Signed)
spoke with pt to notify of cancelled MRI. Pt acknowledged

## 2022-06-09 ENCOUNTER — Ambulatory Visit: Admission: RE | Admit: 2022-06-09 | Payer: Medicare Other | Source: Ambulatory Visit

## 2022-06-18 ENCOUNTER — Ambulatory Visit
Admission: RE | Admit: 2022-06-18 | Discharge: 2022-06-18 | Disposition: A | Discharge status: Home / Self Care | Payer: Medicare Other | Source: Ambulatory Visit | Attending: Oncology | Admitting: Oncology

## 2022-06-18 DIAGNOSIS — C182 Malignant neoplasm of ascending colon: Secondary | ICD-10-CM | POA: Diagnosis present

## 2022-06-18 MED ORDER — GADOBUTROL 1 MMOL/ML IV SOLN
7.5000 mL | Freq: Once | INTRAVENOUS | Status: AC | PRN
  Administered 2022-06-18: 7.5 mL via INTRAVENOUS

## 2022-06-19 ENCOUNTER — Telehealth: Payer: Self-pay | Admitting: *Deleted

## 2022-06-19 ENCOUNTER — Telehealth: Payer: Self-pay

## 2022-06-19 NOTE — Telephone Encounter (Signed)
Called report  IMPRESSION: 1. Small nodule of the lateral limb of the right adrenal gland measuring 1.1 x 0.8 cm contains substantial macroscopic fat, consistent with a benign adrenal adenoma. 2. New left hydronephrosis and hydroureter with multiple small calyceal calculi. No calculus or other obstructing etiology identified in the imaged portion of the proximal to mid ureter. 3. Hepatic iron deposition and probable hepatic steatosis.   These results will be called to the ordering clinician or representative by the Radiologist Assistant, and communication documented in the PACS or [employer].     Electronically Signed   By: Delanna Ahmadi M.D.   On: 06/19/2022 10:40

## 2022-06-19 NOTE — Telephone Encounter (Signed)
Called and provided Todd Trevino the results of his MRI. He still has not made appointment with surgery for his colon cancer. He states he was waiting to see the results of his MRI. He has new hydronephrosis and is an established patient of Dr. Erlene Quan for prostate cancer. I have communicated with Dr. Erlene Quan and he needs to see her for the hydronephrosis and his prostate cancer. He was scheduled for a surveillance prostate biopsy and did not follow up for that. I have stressed the importance to Mr. Tutor regarding his follow up. He is aware that if his colon cancer remains untreated it will continue to grow and spread. I have provided him the phone number to [facility] surgery and told him to call. I have also told him to call and arrange an appointment with Dr. Erlene Quan. He states "okay, one thing at a time". I have stressed he needs to address both of these issues now.

## 2022-06-20 ENCOUNTER — Encounter: Payer: Self-pay | Admitting: Oncology

## 2022-06-23 ENCOUNTER — Encounter: Payer: Self-pay | Admitting: Oncology

## 2022-07-07 ENCOUNTER — Telehealth: Payer: Self-pay

## 2022-07-07 NOTE — Telephone Encounter (Signed)
Called and left Todd Trevino a voicemail to return call. Noted that he has still not made his appointment to see surgeon regarding his colon cancer and has not followed up with Urology regarding his prostate cancer and new hydronephrosis.

## 2022-10-29 ENCOUNTER — Encounter: Payer: Self-pay | Admitting: Emergency Medicine

## 2022-10-29 ENCOUNTER — Emergency Department
Admission: EM | Admit: 2022-10-29 | Discharge: 2022-10-29 | Disposition: A | Discharge status: Home / Self Care | Payer: BC Managed Care – PPO | Attending: Student in an Organized Health Care Education/Training Program | Admitting: Student in an Organized Health Care Education/Training Program

## 2022-10-29 ENCOUNTER — Emergency Department: Payer: BC Managed Care – PPO

## 2022-10-29 ENCOUNTER — Encounter: Payer: Self-pay | Admitting: Oncology

## 2022-10-29 DIAGNOSIS — W19XXXA Unspecified fall, initial encounter: Secondary | ICD-10-CM | POA: Insufficient documentation

## 2022-10-29 DIAGNOSIS — Y990 Civilian activity done for income or pay: Secondary | ICD-10-CM | POA: Diagnosis not present

## 2022-10-29 DIAGNOSIS — R55 Syncope and collapse: Secondary | ICD-10-CM | POA: Diagnosis not present

## 2022-10-29 DIAGNOSIS — M549 Dorsalgia, unspecified: Secondary | ICD-10-CM | POA: Insufficient documentation

## 2022-10-29 DIAGNOSIS — S2242XA Multiple fractures of ribs, left side, initial encounter for closed fracture: Secondary | ICD-10-CM | POA: Diagnosis not present

## 2022-10-29 DIAGNOSIS — S299XXA Unspecified injury of thorax, initial encounter: Secondary | ICD-10-CM | POA: Diagnosis present

## 2022-10-29 HISTORY — DX: Malignant (primary) neoplasm, unspecified: C80.1

## 2022-10-29 LAB — TROPONIN I (HIGH SENSITIVITY)
Troponin I (High Sensitivity): 5 ng/L (ref <18)
Troponin I (High Sensitivity): 5 ng/L (ref <18)

## 2022-10-29 LAB — CBC
HCT: 27 % — ABNORMAL LOW (ref 39.0–52.0)
Hemoglobin: 8 g/dL — ABNORMAL LOW (ref 13.0–17.0)
MCH: 22.2 pg — ABNORMAL LOW (ref 26.0–34.0)
MCHC: 29.6 g/dL — ABNORMAL LOW (ref 30.0–36.0)
MCV: 75 fL — ABNORMAL LOW (ref 80.0–100.0)
Platelets: 507 10*3/uL — ABNORMAL HIGH (ref 150–400)
RBC: 3.6 MIL/uL — ABNORMAL LOW (ref 4.22–5.81)
RDW: 15 % (ref 11.5–15.5)
WBC: 11.4 10*3/uL — ABNORMAL HIGH (ref 4.0–10.5)
nRBC: 0 % (ref 0.0–0.2)

## 2022-10-29 LAB — URINALYSIS, ROUTINE W REFLEX MICROSCOPIC
Bacteria, UA: NONE SEEN
Bilirubin Urine: NEGATIVE
Glucose, UA: NEGATIVE mg/dL
Hgb urine dipstick: NEGATIVE
Ketones, ur: NEGATIVE mg/dL
Nitrite: NEGATIVE
Protein, ur: 30 mg/dL — AB
Specific Gravity, Urine: 1.023 (ref 1.005–1.030)
pH: 6 (ref 5.0–8.0)

## 2022-10-29 LAB — BASIC METABOLIC PANEL
Anion gap: 9 (ref 5–15)
BUN: 18 mg/dL (ref 8–23)
CO2: 25 mmol/L (ref 22–32)
Calcium: 9.1 mg/dL (ref 8.9–10.3)
Chloride: 101 mmol/L (ref 98–111)
Creatinine, Ser: 1.24 mg/dL (ref 0.61–1.24)
GFR, Estimated: >60 mL/min (ref >60)
Glucose, Bld: 161 mg/dL — ABNORMAL HIGH (ref 70–99)
Potassium: 4.5 mmol/L (ref 3.5–5.1)
Sodium: 135 mmol/L (ref 135–145)

## 2022-10-29 MED ORDER — IOHEXOL 300 MG/ML  SOLN: 100.0000 mL | Freq: Once | INTRAMUSCULAR | Status: DC | PRN

## 2022-10-29 MED ORDER — IOHEXOL 240 MG/ML SOLN: 100.0000 mL | Freq: Once | INTRAMUSCULAR | Status: DC | PRN

## 2022-10-29 MED ORDER — SODIUM CHLORIDE 0.9 % IV BOLUS
500.0000 mL | Freq: Once | INTRAVENOUS | Status: AC
  Administered 2022-10-29: 500 mL via INTRAVENOUS

## 2022-10-29 MED ORDER — LIDOCAINE 5 % EX PTCH
1.0000 | MEDICATED_PATCH | CUTANEOUS | Status: DC
  Administered 2022-10-29: 1 via TRANSDERMAL
  Filled 2022-10-29: qty 1

## 2022-10-29 MED ORDER — LIDOCAINE 5 % EX PTCH: 1.0000 | MEDICATED_PATCH | Freq: Two times a day (BID) | CUTANEOUS | 0 refills | Status: AC

## 2022-10-29 MED ORDER — IOHEXOL 350 MG/ML SOLN
100.0000 mL | Freq: Once | INTRAVENOUS | Status: AC | PRN
  Administered 2022-10-29: 100 mL via INTRAVENOUS

## 2022-10-29 MED ORDER — TRAMADOL HCL 50 MG PO TABS: 50.0000 mg | ORAL_TABLET | Freq: Four times a day (QID) | ORAL | 0 refills | Status: DC | PRN

## 2022-10-29 NOTE — ED Provider Notes (Signed)
[facility] Provider Note    Event Date/Time   First MD Initiated Contact with Patient 10/29/22 [phone]     (approximate)   History   Loss of Consciousness   HPI  Todd Trevino is a 67 y.o. male who presents to the ER for evaluation of loss of consciousness.  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  Put his head down his next thing he knows he woke up over the machine.  Has had a fall in all.  Does not recall hitting his head.  Is complaining of back pain and side pain.  Denies biting his tongue no loss of bladder control.  Denies any headache no chest pain right now.  Has never had back pain or side pain like this before.  Does have history of chronic anemia not on any blood thinners.       Physical Exam   Triage Vital Signs: ED Triage Vitals [10/29/22 0630]  Enc Vitals Group     BP 138/61     Pulse Rate 84     Resp 16     Temp 98.6 F (37 C)     Temp Source Oral     SpO2 100 %     Weight      Height      Head Circumference      Peak Flow      Pain Score 5     Pain Loc      Pain Edu?      Excl. in GC?     Most recent vital signs: Vitals:   10/29/22 0630  BP: 138/61  Pulse: 84  Resp: 16  Temp: 98.6 F (37 C)  SpO2: 100%     Constitutional: Alert  Eyes: Conjunctivae are normal.  Head: Atraumatic. Nose: No congestion/rhinnorhea. Mouth/Throat: Mucous membranes are moist.   Neck: Painless ROM.  Cardiovascular:   Good peripheral circulation. Respiratory: Normal respiratory effort.  No retractions.  Gastrointestinal: Soft and nontender.  Musculoskeletal:  no deformity Neurologic:  MAE spontaneously. No gross focal neurologic deficits are appreciated.  Skin:  Skin is warm, dry and intact. No rash noted. Psychiatric: Mood and affect are normal. Speech and behavior are normal.    ED Results / Procedures / Treatments   Labs (all labs ordered are listed, but only abnormal  results are displayed) Labs Reviewed  BASIC METABOLIC PANEL - Abnormal; Notable for the following components:      Result Value   Glucose, Bld 161 (*)    All other components within normal limits  CBC - Abnormal; Notable for the following components:   WBC 11.4 (*)    RBC 3.60 (*)    Hemoglobin 8.0 (*)    HCT 27.0 (*)    MCV 75.0 (*)    MCH 22.2 (*)    MCHC 29.6 (*)    Platelets 507 (*)    All other components within normal limits  URINALYSIS, ROUTINE W REFLEX MICROSCOPIC - Abnormal; Notable for the following components:   Color, Urine YELLOW (*)    APPearance HAZY (*)    Protein, ur 30 (*)    Leukocytes,Ua TRACE (*)    All other components within normal limits  TROPONIN I (HIGH SENSITIVITY)  TROPONIN I (HIGH SENSITIVITY)     EKG  ED ECG REPORT I, Willy Eddy, the attending physician, personally viewed and interpreted this ECG.   Date: 10/29/2022  EKG Time: 6:28  Rate: 85  Rhythm: sinus  Axis: normal  Intervals: normal  ST&T Change: no stemi    RADIOLOGY Please see ED Course for my review and interpretation.  I personally reviewed all radiographic images ordered to evaluate for the above acute complaints and reviewed radiology reports and findings.  These findings were personally discussed with the patient.  Please see medical record for radiology report.    PROCEDURES:  Critical Care performed: No  Procedures   MEDICATIONS ORDERED IN ED: Medications  iohexol (OMNIPAQUE) 300 MG/ML solution 100 mL (has no administration in time range)  iohexol (OMNIPAQUE) 240 MG/ML injection 100 mL (has no administration in time range)  lidocaine (LIDODERM) 5 % 1 patch (1 patch Transdermal Patch Applied 10/29/22 1011)  sodium chloride 0.9 % bolus 500 mL (0 mLs Intravenous Stopped 10/29/22 1011)  iohexol (OMNIPAQUE) 350 MG/ML injection 100 mL (100 mLs Intravenous Contrast Given 10/29/22 0900)     IMPRESSION / MDM / ASSESSMENT AND PLAN / ED COURSE  I reviewed the  triage vital signs and the nursing notes.                              Differential diagnosis includes, but is not limited to, dehydration, electrolyte abnormality, orthostasis, CVA, SAH, dissection, AAA, fracture  Patient presenting to the ER for evaluation of symptoms as described above.  Based on symptoms, risk factors and considered above differential, this presenting complaint could reflect a potentially life-threatening illness therefore the patient will be placed on continuous pulse oximetry and telemetry for monitoring.  Laboratory evaluation will be sent to evaluate for the above complaints.  CT imaging my review and interpretation does not show any evidence of SAH or IPH.  Neuro intact.  EKG with some nonspecific changes but troponin negative.  Will give IV fluids.  Given back pain and syncope will order CTA to evaluate for dissection or aneurysm.    Clinical Course as of 10/29/22 1018  Wed Oct 29, 2022  1001 CT imaging on my review and interpretation without any evidence of pneumothorax AAA or dissection.  There is evidence of 10th and 11th rib fracture and transverse process fracture.  Will give pain control.  Patient remains well-appearing in no acute distress.  At this point suspect component of dehydration for his fall.  Possible fatigue.  I do believe he is stable and appropriate for outpatient follow-up. [PR]    Clinical Course User Index [PR] Willy Eddy, MD     FINAL CLINICAL IMPRESSION(S) / ED DIAGNOSES   Final diagnoses:  Closed fracture of multiple ribs of left side, initial encounter  Syncope and collapse     Rx / DC Orders   ED Discharge Orders          Ordered    lidocaine (LIDODERM) 5 %  Every 12 hours        10/29/22 1016    traMADol (ULTRAM) 50 MG tablet  Every 6 hours PRN        10/29/22 1016             Note:  This document was prepared using Dragon voice recognition software and may include unintentional dictation errors. Macy Mis, MD 10/29/22 1018

## 2022-10-29 NOTE — ED Notes (Signed)
See triage note. Pt c/o pain to left rib cage area. Denies dizziness at this time. Denies h/a. NAD noted.

## 2022-10-29 NOTE — ED Triage Notes (Signed)
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 Pt unaware of how long LOC was as well as unaware if he hit his head. Pt c/o left back pain at the lower ribcage area. No bruising, redness or swelling noted to head, face, arms or back. Hx/o anemia, recent colon cancer diagnosis(untreated) and hypertension.

## 2022-11-18 ENCOUNTER — Ambulatory Visit: Payer: BC Managed Care – PPO | Admitting: Urology

## 2022-11-18 VITALS — BP 129/67 | HR 67 | Ht 66.0 in | Wt 156.2 lb

## 2022-11-18 DIAGNOSIS — N200 Calculus of kidney: Secondary | ICD-10-CM | POA: Diagnosis not present

## 2022-11-18 DIAGNOSIS — C182 Malignant neoplasm of ascending colon: Secondary | ICD-10-CM | POA: Diagnosis not present

## 2022-11-18 DIAGNOSIS — C61 Malignant neoplasm of prostate: Secondary | ICD-10-CM

## 2022-11-18 DIAGNOSIS — N401 Enlarged prostate with lower urinary tract symptoms: Secondary | ICD-10-CM | POA: Diagnosis not present

## 2022-11-18 LAB — URINALYSIS, COMPLETE
Bilirubin, UA: NEGATIVE
Glucose, UA: NEGATIVE
Ketones, UA: NEGATIVE
Nitrite, UA: NEGATIVE
Protein,UA: NEGATIVE
RBC, UA: NEGATIVE
Specific Gravity, UA: 1.01 (ref 1.005–1.030)
Urobilinogen, Ur: 0.2 mg/dL (ref 0.2–1.0)
pH, UA: 6 (ref 5.0–7.5)

## 2022-11-18 LAB — MICROSCOPIC EXAMINATION

## 2022-11-18 NOTE — Patient Instructions (Addendum)
Prostate Biopsy Instructions  Stop all aspirin or blood thinners (aspirin, plavix, coumadin, warfarin, motrin, ibuprofen, advil, aleve, naproxen, naprosyn) for 7 days prior to the procedure.  If you have any questions about stopping these medications, please contact your primary care physician or cardiologist.  Having a light meal prior to the procedure is recommended.  If you are diabetic or have low blood sugar please bring a small snack or glucose tablet.  A Fleets enema is needed to be purchased over the counter at a local pharmacy and used 2 hours before you scheduled appointment.  This can be purchased over the counter at any pharmacy.  Antibiotics will be administered in the clinic at the time of the procedure unless otherwise specified.    Please bring someone with you to the procedure to drive you home.  A follow up appointment has been scheduled for you to receive the results of the biopsy.  If you have any questions or concerns, please feel free to call the office at [phone] or send a Mychart message.    Thank you, Staff at [facility]  

## 2022-11-18 NOTE — Progress Notes (Signed)
I,Amy L Pierron,acting as a scribe for Vanna Scotland, MD.,have documented all relevant documentation on the behalf of Vanna Scotland, MD,as directed by  Vanna Scotland, MD while in the presence of Vanna Scotland, MD.  11/18/2022 2:49 PM   Todd Trevino May 07, 1956 [id number]  Referring provider: Gracelyn Nurse, MD [address] [place],  [place] [address]  Chief Complaint  Patient presents with   Nephrolithiasis    HPI: 67 year-old male presents today for further evaluation of a bladder stone.   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 Since then he has not had any additional surveillance or diagnostic screening. He had a prostate MRI in 2020.  He ended up establishing care Dr. Alvina Filbert.  He was taken to the OR for cystolitholapaxy back in 2018 at which time a large bladder stone was treated.  Interestingly, bladder stone composition was 100% cystine.   He was also noted to have an elevated PSA and underwent prostate biopsy on 11/06/2016 which did confirm the presence of low risk prostate cancer, a single core of Gleason 3+4 involving only 5% of the tissue at the left base along with 2 additional cores of Gleason 3+3.  iPSA 6.37  He was initially seen and evaluated here in November. At that point, his PSA had risen to 8.09. It was decided to proceed with a biopsy he called in December and canceled. It has yet to be rescheduled.  In the interim he was diagnosed with colon cancer. He has yet to follow up with his colorectal surgeon, and with urology.   He also was noted to have new hydronephrosis on imaging, CT of the chest, abdomen, and pelvis, as part of his staging, which is not previously appreciated. He was advised through the Cancer Center the importance of following up with urology, but never did so. He's also known to have a new right-sided adrenal nodule, left-sided non-obstructing kidney stones, and a circumferential bladder wall  thickening and marked prostomegaly. The stone on the left side is 10 millimeters lower pole and non-obstructing. The hydronephrosis is more readily appreciated on MRI from June 18, 2022.  His most recent creatinine on 10/29/2022 was 1.24, which is up from 0.9 on 05/08/2022.   He said that he hasn't done anything for the colon cancer yet because he wants to work on his bladder stone because it is more bothersome to him. He said his prostate and bladder stone are the issues he can "feel in his body". He was not able to have the biopsy due other things getting in the way and other bills to pay.    PMH: Past Medical History:  Diagnosis Date   Abnormal prostate specific antigen 11/21/2014   Ankle edema    Aortic heart murmur    BP (high blood pressure) 12/26/2014   BPH (benign prostatic hyperplasia)    Cancer (HCC)    Colon cancer (HCC) 05/02/2022   Elevated PSA    HLD (hyperlipidemia) 12/26/2014   HTN (hypertension)    Hyperlipidemia     Surgical History: Past Surgical History:  Procedure Laterality Date   none      Home Medications:  Allergies as of 11/18/2022   No Known Allergies      Medication List        Accurate as of November 18, 2022  2:49 PM. If you have any questions, ask your nurse or doctor.          STOP taking  these medications    aspirin 81 MG tablet   finasteride 5 MG tablet Commonly known as: PROSCAR   traMADol 50 MG tablet Commonly known as: Ultram       TAKE these medications    amLODipine 2.5 MG tablet Commonly known as: NORVASC Take 2.5 mg by mouth daily.   lidocaine 5 % Commonly known as: Lidoderm Place 1 patch onto the skin every 12 (twelve) hours. Remove & Discard patch within 12 hours or as directed by MD   lisinopril-hydrochlorothiazide 10-12.5 MG tablet Commonly known as: ZESTORETIC Take 1 tablet by mouth daily.   metoprolol succinate 50 MG 24 hr tablet Commonly known as: TOPROL-XL Take 50 mg by mouth daily.   pravastatin  40 MG tablet Commonly known as: PRAVACHOL Take 40 mg by mouth daily.   tamsulosin 0.4 MG Caps capsule Commonly known as: FLOMAX Take 0.4 mg by mouth. Reported on 06/29/2015        Family History: Family History  Problem Relation Age of Onset   Hypertension Mother    Hypertension Father    Stomach cancer Father    Hypertension Sister    Prostate cancer Brother    Kidney disease Neg Hx     Social History:  reports that he quit smoking about 41 years ago. His smoking use included cigarettes. He has a 1.50 pack-year smoking history. He has never used smokeless tobacco. He reports that he does not drink alcohol and does not use drugs.   Physical Exam: BP (!) 155/67   Pulse 80   Ht 5\' 6"  (1.676 m)   Wt 156 lb 4 oz (70.9 kg)   BMI 25.22 kg/m   Constitutional:  Alert and oriented, No acute distress. HEENT: [place] AT, moist mucus membranes.  Trachea midline, no masses. Neurologic: Grossly intact, no focal deficits, moving all 4 extremities. Psychiatric: Normal mood and affect.  Pertinent Imaging: CLINICAL DATA:  Colon cancer, initial staging examination. Iron deficiency anemia.   EXAM: CT CHEST, ABDOMEN, AND PELVIS WITH CONTRAST   TECHNIQUE: Multidetector CT imaging of the chest, abdomen and pelvis was performed following the standard protocol during bolus administration of intravenous contrast.   RADIATION DOSE REDUCTION: This exam was performed according to the departmental dose-optimization program which includes automated exposure control, adjustment of the mA and/or kV according to patient size and/or use of iterative reconstruction technique.   CONTRAST:  85mL OMNIPAQUE IOHEXOL 300 MG/ML  SOLN   COMPARISON:  CT abdomen pelvis 06/30/2016   FINDINGS: CT CHEST FINDINGS   Cardiovascular: No significant vascular findings. Normal heart size. No pericardial effusion.   Mediastinum/Nodes: No enlarged mediastinal, hilar, or axillary lymph nodes. Thyroid gland,  trachea, and esophagus demonstrate no significant findings.   Lungs/Pleura: Lungs are clear. No pleural effusion or pneumothorax.   Musculoskeletal: No chest wall mass or suspicious bone lesions identified.   CT ABDOMEN PELVIS FINDINGS   Hepatobiliary: No focal liver abnormality is seen. No gallstones, gallbladder wall thickening, or biliary dilatation.   Pancreas: Unremarkable   Spleen: Unremarkable   Adrenals/Urinary Tract: 10 mm nodule has developed within the right adrenal gland, indeterminate on this examination measuring 80 Hounsfield units in density. Left adrenal gland is unremarkable. The kidneys are normal in size and position. Multiple nonobstructing calculi are seen within the left kidney measuring up to 11 mm in greatest dimension. No enhancing intrarenal mass. No hydronephrosis. No ureteral calculi. The bladder is circumferentially thick walled in keeping with changes of chronic bladder outlet obstruction. The bladder, however,  is not distended. Previously noted bladder calculus as been evacuated.   Stomach/Bowel: Annular mass is noted within the distal ascending colon, best seen on axial image # 77/2, in keeping with the patient's reported primary colonic malignancy. There is pericolonic infiltrative change noted along the anti mesenteric border of the colon suggesting transmural extension of the mass. There is adjacent pathologic mediastinal adenopathy with multiple lymph nodes measuring up to 9 mm in diameter within the ascending colonic mesentery. There is, additionally, a 10 mm, pathologically enlarged lymph node within the mesenteric root at axial image # 71/2. No evidence of obstruction though the luminal diameter appears markedly narrowed secondary to the annular mass.   Mild sigmoid colonic diverticulosis. The stomach, small bowel, and large bowel are otherwise unremarkable. Appendix normal. No free intraperitoneal gas fluid.   Vascular/Lymphatic:  The abdominal vasculature is unremarkable. Aside from previously mention pathologic mesenteric adenopathy, no additional pathologic adenopathy is seen within the retroperitoneum and pelvis.   Reproductive: Marked prostatic enlargement.   Other: Small bilateral fat containing inguinal hernias are present.   Musculoskeletal: Osseous structures are age-appropriate. No acute bone abnormality. No lytic or blastic bone lesions are identified.   IMPRESSION: 1. Annular mass within the distal ascending colon in keeping with the patient's reported primary colonic malignancy. Pericolonic infiltrative change along the anti mesenteric border of the colon suggest transmural infiltration of the mass. Associated pathologic mesenteric adenopathy within the ascending colonic mesentery and mesenteric root. 2. 10 mm nodule within the right adrenal gland, indeterminate on this examination, but new since prior examination of 06/30/2016. This would be better assessed with dedicated adrenal protocol MRI examination. 3. Mild left nonobstructing nephrolithiasis. 4. Marked prostatic enlargement. Circumferential bladder wall thickening in keeping with changes of chronic bladder outlet obstruction. Previously noted bladder calculus as been evacuated.    Electronically Signed   By: Helyn Numbers M.D.   On: 05/23/2022 21:17  Personally reviewed the images and agree with radiologic interpretation.    Assessment & Plan:    Left sided kidney stone  - Non-obstructing. Would not recommend any intervention.  2. Prostate cancer, intermediate risk  - Previously advised biopsy and continue to strongly recommend this.  - Plan for biopsy to be done on July 3.   3. BPH with LUTS  - Currently on Flomax. No longer on Finasteride.  - He has prostatomegaly, a sequela of chronic outlet obstruction.   - He also has a stone in his bladder, which may or may not be a bladder stone versus kidney stone that passed to his  bladder. He may or may not pass this on his own spontaneously. If he doesn't, would recommend consideration of an outlet procedure at the time of bladder stone treatment. That being said, we need to address his prostate cancer first to further define which procedure or intervention would be most beneficial to him.  4. Colon cancer  - Hasn't followed up with cancer center in over 6 months. Strongly recommend that he does. This, in my opinion, is the most pressing issue and waiting to treat it is ill-advised. He is adamant that he would rather have his GU issues addressed first since they cause him pain.    No follow-ups on file.   [facility] [address] [place], [place] [address] [phone]

## 2022-12-03 ENCOUNTER — Ambulatory Visit: Payer: BC Managed Care – PPO | Admitting: Urology

## 2022-12-03 ENCOUNTER — Encounter: Payer: Self-pay | Admitting: Oncology

## 2022-12-03 VITALS — BP 147/76 | HR 82 | Ht 66.0 in | Wt 151.0 lb

## 2022-12-03 DIAGNOSIS — N4231 Prostatic intraepithelial neoplasia: Secondary | ICD-10-CM | POA: Diagnosis not present

## 2022-12-03 DIAGNOSIS — C61 Malignant neoplasm of prostate: Secondary | ICD-10-CM

## 2022-12-03 DIAGNOSIS — Z2989 Encounter for other specified prophylactic measures: Secondary | ICD-10-CM

## 2022-12-03 MED ORDER — GENTAMICIN SULFATE 40 MG/ML IJ SOLN
80.0000 mg | Freq: Once | INTRAMUSCULAR | Status: AC
  Administered 2022-12-03: 80 mg via INTRAMUSCULAR

## 2022-12-03 MED ORDER — LEVOFLOXACIN 500 MG PO TABS
500.0000 mg | ORAL_TABLET | Freq: Once | ORAL | Status: AC
  Administered 2022-12-03: 500 mg via ORAL

## 2022-12-03 NOTE — Progress Notes (Signed)
   12/03/22  CC:  Chief Complaint  Patient presents with   Prostate Biopsy    HPI: 67 year old male with a personal history of prostate cancer/rising PSA who presents today for biopsy.  Please see previous notes for details.  Blood pressure (!) 155/69, pulse 97, height 5\' 6"  (1.676 m), weight 151 lb (68.5 kg). NED. A&Ox3.   No respiratory distress   Abd soft, NT, ND Normal sphincter tone  Prostate Biopsy Procedure   Informed consent was obtained after discussing risks/benefits of the procedure.  A time out was performed to ensure correct patient identity.  Pre-Procedure: - Gentamicin given prophylactically - Levaquin 500 mg administered PO -Transrectal Ultrasound performed revealing a 82.75 gm prostate -Significant calcifications in the peripheral zone bilaterally.  Markedly enlarged SV's bilaterally.  Bladder stone appreciated.  Procedure: - Prostate block performed using 10 cc 1% lidocaine and biopsies taken from sextant areas, a total of 12 under ultrasound guidance.  Post-Procedure: - Patient tolerated the procedure well - He was counseled to seek immediate medical attention if experiences any severe pain, significant bleeding, or fevers - Return in one week to discuss biopsy results    Vanna Scotland, MD

## 2022-12-03 NOTE — Progress Notes (Signed)
Todd Trevino presents for a procedure visit. BP today is _147/76__________. He is complaint with BP medication. Greater than 140/90. Provider  notified. Pt advised to__f/u with PCP____________. Pt voiced understanding.

## 2022-12-10 ENCOUNTER — Telehealth: Payer: BC Managed Care – PPO | Admitting: Urology

## 2022-12-11 ENCOUNTER — Ambulatory Visit (INDEPENDENT_AMBULATORY_CARE_PROVIDER_SITE_OTHER): Payer: BC Managed Care – PPO | Admitting: Urology

## 2022-12-11 VITALS — BP 144/69 | HR 92 | Ht 66.0 in | Wt 151.1 lb

## 2022-12-11 DIAGNOSIS — C182 Malignant neoplasm of ascending colon: Secondary | ICD-10-CM

## 2022-12-11 DIAGNOSIS — C61 Malignant neoplasm of prostate: Secondary | ICD-10-CM

## 2022-12-11 DIAGNOSIS — N210 Calculus in bladder: Secondary | ICD-10-CM

## 2022-12-11 DIAGNOSIS — N401 Enlarged prostate with lower urinary tract symptoms: Secondary | ICD-10-CM | POA: Diagnosis not present

## 2022-12-11 NOTE — Progress Notes (Signed)
12/11/2022 1:45 PM   Todd Trevino 1956/05/14 [id number]  Referring provider: Gracelyn Nurse, MD [address] [place],  [place] [address]  Chief Complaint  Patient presents with   Results    HPI: 67 year old male with a personal history of chronic outlet obstruction, Sistine bladder and kidney stones, prostate cancer and colon cancer (both untreated) who presents today for follow-up following repeat biopsy.  Notably, he was first diagnosed with prostate cancer, Gleason 3 procedure 3+4 manage on active surveillance in 2018 by Dr. Achilles Dunk at [facility].  He essentially was lost to follow-up.  His PSA continued to rise most recently up to 8.09.  In the interim, he continues to have worsening urinary issues urgency and irritation from his bladder stone.  He wants to have this addressed.  As part of the workup and planning for this, I did agree to undergo repeat biopsy to reassess for any interval progression of his disease which was suspected.  Prostate volume 82.75 g.  Abnormal SV's bilaterally.  Significant calcification in the peripheral zone.  Stone in bladder seen on ultrasound.  IPSS and Shim below.  He has mild erectile dysfunction along with irritative urinary symptoms.  Later last year, he was diagnosed with partially obstructing colon cancer.  He underwent staging CT chest abdomen pelvis back in December which did show the annular mass within the colon, concern for pericolic fold TRUS changes along the antimesenteric border of the colon concerning for transmural infiltration as well as associated mesenteric adenopathy within the ascending colonic mesentery and mesenteric root.  He also had an new the appreciated adrenal nodule for which you underwent an abdominal MRI which showed that this was likely a benign adenoma.  He has deferred treatment due to concern for cost although he does have Medicare as well as a supplemental insurance.  He recently sustained a fall with back pain  and underwent CT angio chest abdomen pelvis which shows a now 1 cm presumed bladder stone near the left UVJ that was previously in his kidney, similar changes previously described of the ascending colon along with some other mesenteric lymph nodes measuring up to 1.1 cm.       IPSS     Row Name 12/11/22 1300         International Prostate Symptom Score   How often have you had the sensation of not emptying your bladder? Not at All     How often have you had to urinate less than every two hours? Less than half the time     How often have you found you stopped and started again several times when you urinated? Less than 1 in 5 times     How often have you found it difficult to postpone urination? Less than 1 in 5 times     How often have you had a weak urinary stream? Less than 1 in 5 times     How often have you had to strain to start urination? Not at All     How many times did you typically get up at night to urinate? 5 Times     Total IPSS Score 10       Quality of Life due to urinary symptoms   If you were to spend the rest of your life with your urinary condition just the way it is now how would you feel about that? Mostly Disatisfied              Score:  1-7 Mild 8-19 Moderate 20-35 Severe  SHIM     Row Name 12/11/22 1344         SHIM: Over the last 6 months:   How do you rate your confidence that you could get and keep an erection? Moderate     When you had erections with sexual stimulation, how often were your erections hard enough for penetration (entering your partner)? Most Times (much more than half the time)     During sexual intercourse, how often were you able to maintain your erection after you had penetrated (entered) your partner? Most Times (much more than half the time)     During sexual intercourse, how difficult was it to maintain your erection to completion of intercourse? Not Difficult     When you attempted sexual intercourse, how often was it  satisfactory for you? Almost Always or Always       SHIM Total Score   SHIM 21              Score: 1-7 Severe ED 8-11 Moderate ED 12-16 Mild-Moderate ED 17-21 Mild ED 22-25 No ED   PMH: Past Medical History:  Diagnosis Date   Abnormal prostate specific antigen 11/21/2014   Ankle edema    Aortic heart murmur    BP (high blood pressure) 12/26/2014   BPH (benign prostatic hyperplasia)    Cancer (HCC)    Colon cancer (HCC) 05/02/2022   Elevated PSA    HLD (hyperlipidemia) 12/26/2014   HTN (hypertension)    Hyperlipidemia     Surgical History: Past Surgical History:  Procedure Laterality Date   none      Home Medications:  Allergies as of 12/11/2022   No Known Allergies      Medication List        Accurate as of December 11, 2022  1:45 PM. If you have any questions, ask your nurse or doctor.          amLODipine 10 MG tablet Commonly known as: NORVASC Take 1 tablet by mouth daily. What changed: Another medication with the same name was removed. Continue taking this medication, and follow the directions you see here.   lidocaine 5 % Commonly known as: Lidoderm Place 1 patch onto the skin every 12 (twelve) hours. Remove & Discard patch within 12 hours or as directed by MD   lisinopril-hydrochlorothiazide 10-12.5 MG tablet Commonly known as: ZESTORETIC Take 1 tablet by mouth daily.   metoprolol succinate 50 MG 24 hr tablet Commonly known as: TOPROL-XL Take 50 mg by mouth daily.   pravastatin 40 MG tablet Commonly known as: PRAVACHOL Take 40 mg by mouth daily.   tamsulosin 0.4 MG Caps capsule Commonly known as: FLOMAX Take 0.4 mg by mouth. Reported on 06/29/2015        Allergies: No Known Allergies  Family History: Family History  Problem Relation Age of Onset   Hypertension Mother    Hypertension Father    Stomach cancer Father    Hypertension Sister    Prostate cancer Brother    Kidney disease Neg Hx     Social History:  reports  that he quit smoking about 41 years ago. His smoking use included cigarettes. He started smoking about 44 years ago. He has a 1.5 pack-year smoking history. He has never used smokeless tobacco. He reports that he does not drink alcohol and does not use drugs.   Physical Exam: BP (!) 144/69   Pulse 92   Ht 5\' 6"  (  1.676 m)   Wt 151 lb 2 oz (68.5 kg)   BMI 24.39 kg/m   Constitutional:  Alert and oriented, No acute distress. HEENT: [place] AT, moist mucus membranes.  Trachea midline, no masses. Cardiovascular: No clubbing, cyanosis, or edema. Respiratory: Normal respiratory effort, no increased work of breathing. GI: Abdomen is soft, nontender, nondistended, no abdominal masses GU: No CVA tenderness Skin: No rashes, bruises or suspicious lesions. Neurologic: Grossly intact, no focal deficits, moving all 4 extremities. Psychiatric: Normal mood and affect.  Laboratory Data: Lab Results  Component Value Date   WBC 11.4 (H) 10/29/2022   HGB 8.0 (L) 10/29/2022   HCT 27.0 (L) 10/29/2022   MCV 75.0 (L) 10/29/2022   PLT 507 (H) 10/29/2022    Lab Results  Component Value Date   CREATININE 1.24 10/29/2022     Pertinent Imaging: I personally reviewed the CT chest abdomen pelvis again today from 05/23/2022 as well as 10/29/2022.  Assessment & Plan:    1. Prostate cancer [facility]) The patient was counseled about the natural history of prostate cancer and the standard treatment options that are available for prostate cancer. It was explained to him how his age and life expectancy, clinical stage, Gleason score, and PSA affect his prognosis, the decision to proceed with additional staging studies, as well as how that information influences recommended treatment strategies. We discussed the roles for active surveillance, radiation therapy, surgical therapy, androgen deprivation, as well as ablative therapy options for the treatment of prostate cancer as appropriate to his individual cancer situation. We  discussed the risks and benefits of these options with regard to their impact on cancer control and also in terms of potential adverse events, complications, and impact on quality of life particularly related to urinary, bowel, and sexual function. The patient was encouraged to ask questions throughout the discussion today and all questions were answered to his stated satisfaction. In addition, the patient was provided with and/or directed to appropriate resources and literature for further education about prostate cancer treatment options.  We discussed surgical therapy for prostate cancer including the different available surgical approaches.  Specifically, we discussed robotic prostatectomy with pelvic lymph node dissection based on his restratification.  We discussed, in detail, the risks and expectations of surgery with regard to cancer control, urinary control, and erectile dysfunction as well as expected post operative recovery processed. Additional risks of surgery including but not limited to bleeding, infection, hernia formation, nerve damage, fistula formation, bowel/rectal injury, potentially necessitating colostomy, damage to the urinary tract resulting in urinary leakage, urethral stricture, and cardiopulmonary risk such as myocardial infarction, stroke, death, thromboembolism etc. were explained.   We had a very frank conversation today about his untreated colon cancer.  We discussed that in the setting of localized prostate cancer which is appears to be based on recent imaging, true benefit tends to be 5 to 10 years down the road as in general, prostate cancer is relatively slow.  With untreated likely metastatic colon cancer, is unclear with the true benefit of definitive management for his prostate cancer this point in time would be.  Encouraged him to follow-up with the cancer center as well as see surgical oncologist to consider moving forward with colectomy.  Possibly, this could be done  at the same time his prostatectomy if he is seeking curative intent for both.  Bladder stone could also be addressed at the same time as the prostatectomy, either endoscopically or possibly even robotically.    2. Benign prostatic  hyperplasia with lower urinary tract symptoms, symptom details unspecified Significant bother by his obstructive urinary symptoms in the setting of prostamegaly and bladder stone (previously kidney stone but unable to pass this spontaneously due to size and urinary obstruction)  An alternative to definitive therapy for his prostate cancer would be just to proceed with outlet procedure in the form of a HoLEP with cystolitholapaxy.  This would not be for the intent to cure his prostate cancer.  If he would like to pursue this, if he did elect to treat his prostate cancer down the road, would recommend radiation with potentially adjuvant hormonal therapy for 6 months if he does elect to seek definitive treatment for his prostate cancer.  We discussed alternatives including TURP vs. holmium laser enucleation of the prostate vs. greenlight laser ablation. Differences between the surgical procedures were discussed as well as the risks and benefits of each.  He is most interested in HoLEP.  We discussed the common postoperative course following holep including need for overnight Foley catheter, temporary worsening of irritative voiding symptoms, and occasional stress incontinence which typically lasts up to 6 months but can persist.  We discussed retrograde ejaculation and damage to surrounding structures including the urinary sphincter. Other uncommon complications including hematuria and urinary tract infection.   3. Bladder stone As above  4. Cancer of ascending colon (HCC) Reiterated today that this is his most important problem.  If left untreated, this will most dramatically impact his life expectancy and quality of life.  I have offered to help facilitate plugging back  into the cancer center as well as arranging for surgical consultation.  I will reach back out to his medical oncologist today, after you as well as oncology navigator, Ladora Daniel and give them an update.   The end of the visit today, he expressed that he like 48 hours to really think through his options.  He also alluded that he may seek a second opinion as he has done in the past.  He promised me that he will follow back up with me in the next couple of days to let us know how would like to proceed how we can assist.  Vanna Scotland, MD  [facility] [address] [place], [place] [address] [phone]  I spent 62 total minutes on the day of the encounter including pre-visit review of the medical record, face-to-face time with the patient, and post visit ordering of labs/imaging/tests.

## 2022-12-15 ENCOUNTER — Encounter: Payer: Self-pay | Admitting: Urology

## 2022-12-15 DIAGNOSIS — C61 Malignant neoplasm of prostate: Secondary | ICD-10-CM

## 2022-12-15 DIAGNOSIS — N210 Calculus in bladder: Secondary | ICD-10-CM

## 2022-12-16 NOTE — Telephone Encounter (Signed)
I pulled down referral with information but wanted to make sure its ok to proceed ordering for this provider.

## 2022-12-18 ENCOUNTER — Telehealth: Payer: Self-pay

## 2022-12-18 NOTE — Telephone Encounter (Signed)
Another attempt made to address untreated colon cancer with Mr. Todd Trevino. No answer. Voicemail left to return call.

## 2023-03-06 ENCOUNTER — Telehealth: Payer: Self-pay

## 2023-03-06 NOTE — Telephone Encounter (Signed)
pt saw Dr.Friedlander at [facility] urology for a second opinion but wants to continue care with you, for prostate cancer. Should he come back to follow up with you now or any other recoomendation on how to proceed?

## 2023-03-10 NOTE — Telephone Encounter (Signed)
I would recommend that he follow-up with the cancer center first.  We must have a working plan regarding his colon cancer first before we can devise a plan for his prostate cancer.  This is also Dr. Marletta Lor recommendation reviewing his note.  Happy to help coordinate getting him back into the cancer center (contact Wenda Overland).  Vanna Scotland, MD

## 2023-03-10 NOTE — Telephone Encounter (Signed)
Left message to call back  

## 2023-03-12 NOTE — Telephone Encounter (Signed)
Called no answer

## 2023-03-16 NOTE — Telephone Encounter (Signed)
Called patient's son, ok per notes in the chart, and asked to have patient call us back

## 2023-03-18 NOTE — Telephone Encounter (Signed)
Letter mailed to the patient with the recommendations as I have not been able to reach patient.

## 2023-09-24 ENCOUNTER — Encounter: Payer: Self-pay | Admitting: Oncology

## 2023-09-28 ENCOUNTER — Telehealth: Payer: Self-pay | Admitting: Oncology

## 2023-09-28 NOTE — Telephone Encounter (Signed)
 Received a staff message with a referral from [facility].  "REFERRAL- [facility] INTER[facility] MEDICINE sent to his chart.   Dr Wilhelmenia Harada, This is an established patient that you saw once n 05/08/2022. He had 5 iron  infusions and never came back. Dr Martine Sleek [facility] is referring him for IDA. "    Dr.Yu responded  "Please arrange pt to get lab prior to MD+/- PRBC asap.  Check cbc iron  tibc ferriitn retic panel. Hold tube  Thanks. "    I called pt on 09/25/23 and left a voicemail for pt to call back to schedule these appts

## 2023-10-02 ENCOUNTER — Telehealth: Payer: Self-pay

## 2023-10-02 DIAGNOSIS — D500 Iron deficiency anemia secondary to blood loss (chronic): Secondary | ICD-10-CM

## 2023-10-02 DIAGNOSIS — C182 Malignant neoplasm of ascending colon: Secondary | ICD-10-CM

## 2023-10-02 NOTE — Telephone Encounter (Signed)
-----   Message from [place] E sent at 09/25/2023  9:47 AM EDT ----- Regarding: RE: REFERRAL- [facility] Called pt and left vm to call back to schedule appts ----- Message ----- From: Timmy Forbes, MD Sent: 09/24/2023  10:55 PM EDT To: Craven Do, RN; Glennon Lao; # Subject: RE: REFERRAL- [facility]        Please arrange pt to get lab prior to MD+/- PRBC asap.  Check cbc iron  tibc ferriitn retic panel. Hold tube Thanks. ----- Message ----- From: Glennon Lao Sent: 09/24/2023   1:45 PM EDT To: Craven Do, RN; Ronell Coe, CMA; # Subject: REFERRAL- [facility]            REFERRAL- [facility] MEDICINE sent to his chart.   Dr Wilhelmenia Harada, This is an established patient that you saw once n 05/08/2022. He had 5 iron  infusions and never came back. Dr Martine Sleek [facility] is referring him for IDA.

## 2023-10-05 ENCOUNTER — Encounter: Payer: Self-pay | Admitting: Oncology

## 2024-04-26 ENCOUNTER — Telehealth: Payer: Self-pay

## 2024-04-26 NOTE — Telephone Encounter (Signed)
 Message sent to PCP regarding concern that Todd Trevino has not treated his colon and prostate cancers to our knowledge.
